# Patient Record
Sex: Male | Born: 1965 | Race: Black or African American | Hispanic: No | Marital: Married | Smoking: Current every day smoker
Health system: Southern US, Community
[De-identification: ages and names within clinical notes are randomized; demographics above are authoritative.]

## PROBLEM LIST (undated history)

## (undated) DIAGNOSIS — I10 Essential (primary) hypertension: Secondary | ICD-10-CM

---

## 2002-06-24 ENCOUNTER — Observation Stay (HOSPITAL_COMMUNITY): Admission: EM | Admit: 2002-06-24 | Discharge: 2002-06-24 | Payer: Self-pay | Admitting: Emergency Medicine

## 2002-06-24 ENCOUNTER — Encounter: Payer: Self-pay | Admitting: Family Medicine

## 2002-06-24 ENCOUNTER — Encounter: Payer: Self-pay | Admitting: Emergency Medicine

## 2002-06-30 ENCOUNTER — Encounter: Admission: RE | Admit: 2002-06-30 | Discharge: 2002-06-30 | Payer: Self-pay | Admitting: Family Medicine

## 2002-09-07 ENCOUNTER — Emergency Department (HOSPITAL_COMMUNITY): Admission: EM | Admit: 2002-09-07 | Discharge: 2002-09-07 | Payer: Self-pay | Admitting: Emergency Medicine

## 2002-09-07 ENCOUNTER — Encounter: Payer: Self-pay | Admitting: Emergency Medicine

## 2004-07-10 ENCOUNTER — Emergency Department (HOSPITAL_COMMUNITY): Admission: EM | Admit: 2004-07-10 | Discharge: 2004-07-10 | Payer: Self-pay | Admitting: Internal Medicine

## 2005-04-15 ENCOUNTER — Emergency Department (HOSPITAL_COMMUNITY): Admission: EM | Admit: 2005-04-15 | Discharge: 2005-04-15 | Payer: Self-pay | Admitting: Emergency Medicine

## 2005-07-08 ENCOUNTER — Emergency Department (HOSPITAL_COMMUNITY): Admission: EM | Admit: 2005-07-08 | Discharge: 2005-07-08 | Payer: Self-pay | Admitting: *Deleted

## 2005-12-16 ENCOUNTER — Emergency Department (HOSPITAL_COMMUNITY): Admission: EM | Admit: 2005-12-16 | Discharge: 2005-12-16 | Payer: Self-pay | Admitting: Emergency Medicine

## 2005-12-23 ENCOUNTER — Emergency Department (HOSPITAL_COMMUNITY): Admission: EM | Admit: 2005-12-23 | Discharge: 2005-12-23 | Payer: Self-pay | Admitting: Emergency Medicine

## 2009-04-28 ENCOUNTER — Emergency Department (HOSPITAL_COMMUNITY): Admission: EM | Admit: 2009-04-28 | Discharge: 2009-04-29 | Payer: Self-pay | Admitting: Emergency Medicine

## 2009-08-17 ENCOUNTER — Emergency Department (HOSPITAL_COMMUNITY)
Admission: EM | Admit: 2009-08-17 | Discharge: 2009-08-17 | Payer: Self-pay | Source: Home / Self Care | Admitting: Emergency Medicine

## 2010-08-07 LAB — URINALYSIS, ROUTINE W REFLEX MICROSCOPIC
Bilirubin Urine: NEGATIVE
Hgb urine dipstick: NEGATIVE
Nitrite: NEGATIVE
Protein, ur: NEGATIVE mg/dL

## 2010-10-04 NOTE — Discharge Summary (Signed)
NAME:  CHAMPION, CORALES                            ACCOUNT NO.:  [id number]   MEDICAL RECORD NO.:  [id number]                   PATIENT TYPE:  OBV   LOCATION:  2018                                 FACILITY:  [facility]   PHYSICIAN:  Noelle C. Merilynn Finland, M.D.           DATE OF BIRTH:  1966-03-02   DATE OF ADMISSION:  06/24/2002  DATE OF DISCHARGE:  06/24/2002                                 DISCHARGE SUMMARY   PRIMARY CARE PHYSICIAN:  None.   DISCHARGE DIAGNOSES:  1. Chest pain, ruled out for myocardial infarction.  2. Substance abuse.  3. Tobacco abuse.   DISCHARGE MEDICATIONS:  1. Aspirin 81 mg p.o. daily.  2. Zyban 150 mg p.o. x3 days, then 150 mg p.o. b.i.d., the patient is     advised to quit smoking one week after starting medication, and not to     take medication later then 6 p.m.   DISCHARGE INSTRUCTIONS:  1. No restrictions on activity.  2. Low cholesterol, low fat diet.  3. The patient was advised to stop using cocaine and stop smoking.  He was     also advised that he will need a cardiac stress test as an outpatient.   FOLLOWUP:  Dr. Alvira Philips at the [facility] on Friday,  07/08/02 at 3 p.m.   HISTORY OF PRESENT ILLNESS:  The patient is a 45 year old African-American  male who presented to the ER with several hours of left-sided and substernal  chest pain radiating to his left arm, neck, and back, onset at rest while  drinking beer, associated with shortness of breath and diaphoresis, not  relieved with aspirin, described as a stretching or tingling sensation.  He  was chest pain free by the time of presentation to the emergency room.   HOSPITAL COURSE:  Problem 1.  CHEST PAIN:  The patient was admitted to  telemetry, two sets of cardiac enzymes were negative at the time of  discharge.  TSH normal during admission, fasting lipid panel pending at the  time of discharge.  EKG on admission was unchanged from prior EKG in 1998,  which showed nonspecific ST  and T wave changes with J point elevation in  lead V2.  He was chest pain free throughout admission.  He was discharged  home on a daily aspirin and will need risk stratification with a cardiac  stress test as an outpatient.  Cardiac risk factors include cocaine use,  smoking, family history (the patient's mother had three myocardial  infarctions, the first around age 74).  Problem 2.  SUBSTANCE ABUSE:  The patient reports a history of chronic  cocaine use over the last several years.  Episodes of chest pain are  associated with cocaine use.  His last episode of use was reportedly two  days prior to admission.  Urine drug screen positive for cocaine and  marijuana on admission.  The patient declined  inpatient or outpatient  rehabilitation, stating that he would rather quit on his own.  He was  counseled extensively to quit using illegal drugs.  A case management  consult was ordered during hospitalization for rehabilitation counseling,  but the patient was discharged before this could be obtained.  Problem 3.  TOBACCO ABUSE:  The patient has a 10 year history of smoking 1/2  pack per day.  He was counseled extensively that this would exacerbate any  cardiac problems, and he requested medical treatment for this.  He stated  that he uses cigarettes mainly to relieve the stress and anxiety in his  life, and was given a prescription for Zyban as above, to follow up with his  primary care physician on this.   Also of note, the patient's portable chest x-ray was questionable for  cardiomegaly, and a PA and lateral chest x-ray to further evaluate this was  pending at the time of discharge.   INSTRUCTIONS FOR PRIMARY DOCTOR:  1. The patient should have a stress test for cardiac risk stratification.     He does have some baseline nonspecific ST and T wave changes, so Dr.     Darrick Penna or Dr. McDiarmid should be consulted as to whether an exercise     treadmill test or Cardiolite would be most  appropriate.  2. Pending studies at the time of discharge include fasting lipid panel, PA     and lateral chest x-ray.  3. The patient is currently using Zyban for smoking cessation.                                               Noelle C. Merilynn Finland, M.D.    NCR/MEDQ  D:  06/24/2002  T:  06/25/2002  Job:  [id number]   cc:   Alvira Philips, M.D.  [facility].  [facility] Resident  [place]  [place] [address]  Fax: [phone]

## 2011-09-08 ENCOUNTER — Emergency Department (HOSPITAL_COMMUNITY)
Admission: EM | Admit: 2011-09-08 | Discharge: 2011-09-08 | Disposition: A | Discharge status: Home / Self Care | Payer: Self-pay | Attending: Emergency Medicine | Admitting: Emergency Medicine

## 2011-09-08 ENCOUNTER — Encounter (HOSPITAL_COMMUNITY): Payer: Self-pay | Admitting: *Deleted

## 2011-09-08 DIAGNOSIS — K0889 Other specified disorders of teeth and supporting structures: Secondary | ICD-10-CM

## 2011-09-08 DIAGNOSIS — K089 Disorder of teeth and supporting structures, unspecified: Secondary | ICD-10-CM | POA: Insufficient documentation

## 2011-09-08 DIAGNOSIS — H6120 Impacted cerumen, unspecified ear: Secondary | ICD-10-CM | POA: Insufficient documentation

## 2011-09-08 MED ORDER — PENICILLIN V POTASSIUM 500 MG PO TABS: 500.0000 mg | ORAL_TABLET | Freq: Four times a day (QID) | ORAL | Status: DC

## 2011-09-08 NOTE — ED Provider Notes (Signed)
History     CSN: [id number]  Arrival date & time 09/08/11  4098   First MD Initiated Contact with Patient 09/08/11 1104      Chief Complaint  Patient presents with  . Otalgia    (Consider location/radiation/quality/duration/timing/severity/associated sxs/prior treatment) The history is provided by the patient.   patient reports 3 days of left ear pain.  He reports mild decreased hearing in his left ear.  He's not had 5 fevers or chills.  He has not had upper respiratory symptoms.  He does report he had a left lower first molar dental pain for several days which has not been improving.  He has no facial swelling.  Nothing worsens the symptoms.  Nothing improves his symptoms.  His symptoms are constant.  History reviewed. No pertinent past medical history.  History reviewed. No pertinent past surgical history.  History reviewed. No pertinent family history.  History  Substance Use Topics  . Smoking status: Current Everyday Smoker    Types: Cigarettes  . Smokeless tobacco: Not on file  . Alcohol Use: Yes     occassional      Review of Systems  HENT: Positive for ear pain.   All other systems reviewed and are negative.    Allergies  Review of patient's allergies indicates no known allergies.  Home Medications   Current Outpatient Rx  Name Route Sig Dispense Refill  . SINUS & ALLERGY 12 HOUR PO Oral Take 1 tablet by mouth 2 (two) times daily as needed. For sinus congestion    . PENICILLIN V POTASSIUM 500 MG PO TABS Oral Take 1 tablet (500 mg total) by mouth 4 (four) times daily. 28 tablet 0    BP 158/102  Pulse 69  Temp(Src) 98.4 F (36.9 C) (Oral)  Resp 16  SpO2 96%  Physical Exam  Nursing note and vitals reviewed. Constitutional: He is oriented to person, place, and time. He appears well-developed and well-nourished.  HENT:  Head: Normocephalic and atraumatic.       Impacted cerumen left ear.  Cerumen removed a normal left TM in appearance.  Patient with  significant dental decay throughout but significant decay and tenderness without gingival swelling at left lower first molar  Eyes: EOM are normal.  Neck: Normal range of motion.  Cardiovascular: Normal rate, regular rhythm, normal heart sounds and intact distal pulses.   Pulmonary/Chest: Effort normal and breath sounds normal. No respiratory distress.  Abdominal: Soft. He exhibits no distension. There is no tenderness.  Musculoskeletal: Normal range of motion.  Neurological: He is alert and oriented to person, place, and time.  Skin: Skin is warm and dry.  Psychiatric: He has a normal mood and affect. Judgment normal.    ED Course  EAR CERUMEN REMOVAL Performed by: Lyanne Co Authorized by: Lyanne Co Consent: Verbal consent obtained. Risks and benefits: risks, benefits and alternatives were discussed Consent given by: patient Required items: required blood products, implants, devices, and special equipment available Patient identity confirmed: verbally with patient Time out: Immediately prior to procedure a "time out" was called to verify the correct patient, procedure, equipment, support staff and site/side marked as required. Local anesthetic: none Location details: left ear Procedure type: curette Patient sedated: no Patient tolerance: Patient tolerated the procedure well with no immediate complications.   (including critical care time)  Labs Reviewed - No data to display No results found.   1. Cerumen impaction   2. Pain, dental       MDM  Will treat for possible early dental infection.  Left ear impacted cerumen, cerumen removed without difficulty.  Patient feels much better at this time        Lyanne Co, MD 09/08/11 1124

## 2011-09-08 NOTE — ED Notes (Signed)
To ED for eval of left ear ringing for past 3 days. Denies FB in ear.

## 2011-09-08 NOTE — Discharge Instructions (Signed)
Cerumen Impaction A cerumen impaction is when the wax in your ear forms a plug. This plug usually causes reduced hearing. Sometimes it also causes an earache or dizziness. Removing a cerumen impaction can be difficult and painful. The wax sticks to the ear canal. The canal is sensitive and bleeds easily. If you try to remove a heavy wax buildup with a cotton tipped swab, you may push it in further. Irrigation with water, suction, and small ear curettes may be used to clear out the wax. If the impaction is fixed to the skin in the ear canal, ear drops may be needed for a few days to loosen the wax. People who build up a lot of wax frequently can use ear wax removal products available in your local drugstore. SEEK MEDICAL CARE IF:  You develop an earache, increased hearing loss, or marked dizziness. Document Released: 06/12/2004 Document Revised: 04/24/2011 Document Reviewed: 08/02/2009 [facility] Patient Information 2012 [place], [place].Dental Pain A tooth ache may be caused by cavities (tooth decay). Cavities expose the nerve of the tooth to air and hot or cold temperatures. It may come from an infection or abscess (also called a boil or furuncle) around your tooth. It is also often caused by dental caries (tooth decay). This causes the pain you are having. DIAGNOSIS  Your caregiver can diagnose this problem by exam. TREATMENT   If caused by an infection, it may be treated with medications which kill germs (antibiotics) and pain medications as prescribed by your caregiver. Take medications as directed.   Only take over-the-counter or prescription medicines for pain, discomfort, or fever as directed by your caregiver.   Whether the tooth ache today is caused by infection or dental disease, you should see your dentist as soon as possible for further care.  SEEK MEDICAL CARE IF: The exam and treatment you received today has been provided on an emergency basis only. This is not a substitute for complete  medical or dental care. If your problem worsens or new problems (symptoms) appear, and you are unable to meet with your dentist, call or return to this location. SEEK IMMEDIATE MEDICAL CARE IF:   You have a fever.   You develop redness and swelling of your face, jaw, or neck.   You are unable to open your mouth.   You have severe pain uncontrolled by pain medicine.  MAKE SURE YOU:   Understand these instructions.   Will watch your condition.   Will get help right away if you are not doing well or get worse.  Document Released: 05/05/2005 Document Revised: 04/24/2011 Document Reviewed: 12/22/2007 [facility] Patient Information 2012 [place], [place].

## 2011-09-15 ENCOUNTER — Emergency Department (HOSPITAL_COMMUNITY)
Admission: EM | Admit: 2011-09-15 | Discharge: 2011-09-15 | Disposition: A | Discharge status: Home / Self Care | Payer: Self-pay | Attending: Emergency Medicine | Admitting: Emergency Medicine

## 2011-09-15 ENCOUNTER — Encounter (HOSPITAL_COMMUNITY): Payer: Self-pay | Admitting: *Deleted

## 2011-09-15 DIAGNOSIS — F17200 Nicotine dependence, unspecified, uncomplicated: Secondary | ICD-10-CM | POA: Insufficient documentation

## 2011-09-15 DIAGNOSIS — L738 Other specified follicular disorders: Secondary | ICD-10-CM | POA: Insufficient documentation

## 2011-09-15 DIAGNOSIS — L739 Follicular disorder, unspecified: Secondary | ICD-10-CM

## 2011-09-15 MED ORDER — ERYTHROMYCIN 2 % EX OINT: TOPICAL_OINTMENT | CUTANEOUS | Status: AC

## 2011-09-15 MED ORDER — IBUPROFEN 600 MG PO TABS: 600.0000 mg | ORAL_TABLET | Freq: Four times a day (QID) | ORAL | Status: AC | PRN

## 2011-09-15 NOTE — ED Notes (Signed)
PT c/o increasing swelling and pain to abcess to back of head.

## 2011-09-15 NOTE — ED Provider Notes (Signed)
History     CSN: [id number]  Arrival date & time 09/15/11  9562   First MD Initiated Contact with Patient 09/15/11 1123      Chief Complaint  Patient presents with  . Skin Problem    pain and swelling to occiput    (Consider location/radiation/quality/duration/timing/severity/associated sxs/prior treatment) HPI Pt with 1 week of bump to back of the head with increasing soreness. No fever chills, drainage.  History reviewed. No pertinent past medical history.  History reviewed. No pertinent past surgical history.  No family history on file.  History  Substance Use Topics  . Smoking status: Current Everyday Smoker    Types: Cigarettes  . Smokeless tobacco: Not on file  . Alcohol Use: Yes     occassional      Review of Systems  Constitutional: Negative for fever and chills.  HENT: Negative for neck pain.   Neurological: Negative for weakness, numbness and headaches.    Allergies  Review of patient's allergies indicates no known allergies.  Home Medications   Current Outpatient Rx  Name Route Sig Dispense Refill  . NAPHAZOLINE-PHENIRAMINE 0.025-0.3 % OP SOLN Both Eyes Place 1 drop into both eyes 4 (four) times daily as needed. For dry eyes    . SINUS & ALLERGY 12 HOUR PO Oral Take 1 tablet by mouth 2 (two) times daily as needed. For sinus congestion    . ERYTHROMYCIN 2 % EX OINT  Apply to affected area 2 times daily 25 g 0  . IBUPROFEN 600 MG PO TABS Oral Take 1 tablet (600 mg total) by mouth every 6 (six) hours as needed for pain. 30 tablet 0    BP 161/106  Pulse 69  Temp(Src) 97.9 F (36.6 C) (Oral)  Resp 16  SpO2 93%  Physical Exam  Nursing note and vitals reviewed. Constitutional: He is oriented to person, place, and time. He appears well-developed and well-nourished.  HENT:  Head: Atraumatic.  Neck: Neck supple.  Pulmonary/Chest: Effort normal.  Abdominal: Soft.  Musculoskeletal: Normal range of motion.  Neurological: He is alert and oriented to  person, place, and time.  Skin:       Folliculitis with mild induration (0.5 cm) and erythema at occiput. No fluctuation or drainage. Mildly ttp    ED Course  Procedures (including critical care time)  Labs Reviewed - No data to display No results found.   1. Folliculitis       MDM          Loren Racer, MD 09/15/11 1143

## 2011-09-15 NOTE — Discharge Instructions (Signed)
Folliculitis  °Folliculitis is an infection and inflammation of the hair follicles. Hair follicles become red and irritated. This inflammation is usually caused by bacteria. The bacteria thrive in warm, moist environments. This condition can be seen anywhere on the body.  °CAUSES °The most common cause of folliculitis is an infection by germs (bacteria). Fungal and viral infections can also cause the condition. Viral infections may be more common in people whose bodies are unable to fight disease well (weakened immune systems). Examples include people with: °· AIDS.  °· An organ transplant.  °· Cancer.  °People with depressed immune systems, diabetes, or obesity, have a greater risk of getting folliculitis than the general population. Certain chemicals, especially oils and tars, also can cause folliculitis. °SYMPTOMS °· An early sign of folliculitis is a small, white or yellow pus-filled, itchy lesion (pustule). These lesions appear on a red, inflamed follicle. They are usually less than 5 mm (.20 inches).  °· The most likely starting points are the scalp, thighs, legs, back and buttocks. Folliculitis is also frequently found in areas of repeated shaving.  °· When an infection of the follicle goes deeper, it becomes a boil or furuncle. A group of closely packed boils create a larger lesion (a carbuncle). These sores (lesions) tend to occur in hairy, sweaty areas of the body.  °TREATMENT  °· A doctor who specializes in skin problems (dermatologists) treats mild cases of folliculitis with antiseptic washes.  °· They also use a skin application which kills germs (topical antibiotics). Tea tree oil is a good topical antiseptic as well. It can be found at a health food store. A small percentage of individuals may develop an allergy to the tea tree oil.  °· Mild to moderate boils respond well to warm water compresses applied three times daily.  °· In some cases, oral antibiotics should be taken with the skin treatment.    °· If lesions contain large quantities of pus or fluid, your caregiver may drain them. This allows the topical antibiotics to get to the affected areas better.  °· Stubborn cases of folliculitis may respond to laser hair removal. This process uses a high intensity light beam (a laser) to destroy the follicle and reduces the scarring from folliculitis. After laser hair removal, hair will no longer grow in the laser treated area.  °Patients with long-lasting folliculitis need to find out where the infection is coming from. Germs can live in the nostrils of the patient. This can trigger an outbreak now and then. Sometimes the bacteria live in the nostrils of a family member. This person does not develop the disorder but they repeatedly re-expose others to the germ. To break the cycle of recurrence in the patient, the family member must also undergo treatment. °PREVENTION  °· Individuals who are predisposed to folliculitis should be extremely careful about personal hygiene.  °· Application of antiseptic washes may help prevent recurrences.  °· A topical antibiotic cream, mupirocin (Bactroban®), has been effective at reducing bacteria in the nostrils. It is applied inside the nose with your little finger. This is done twice daily for a week. Then it is repeated every 6 months.  °· Because follicle disorders tend to come back, patients must receive follow-up care. Your caregiver may be able to recognize a recurrence before it becomes severe.  °SEEK IMMEDIATE MEDICAL CARE IF:  °· You develop redness, swelling, or increasing pain in the area.  °· You have a fever.  °· You are not improving with treatment   or are getting worse.  °· You have any other questions or concerns.  °Document Released: 07/14/2001 Document Revised: 04/24/2011 Document Reviewed: 05/10/2008 °[employer]® Patient Information ©2012 [employer]. °

## 2013-09-12 ENCOUNTER — Emergency Department (HOSPITAL_COMMUNITY)
Admission: EM | Admit: 2013-09-12 | Discharge: 2013-09-12 | Disposition: A | Discharge status: Home / Self Care | Payer: Self-pay | Attending: Emergency Medicine | Admitting: Emergency Medicine

## 2013-09-12 ENCOUNTER — Encounter (HOSPITAL_COMMUNITY): Payer: Self-pay | Admitting: Emergency Medicine

## 2013-09-12 DIAGNOSIS — F17200 Nicotine dependence, unspecified, uncomplicated: Secondary | ICD-10-CM | POA: Insufficient documentation

## 2013-09-12 DIAGNOSIS — M5431 Sciatica, right side: Secondary | ICD-10-CM

## 2013-09-12 DIAGNOSIS — M5430 Sciatica, unspecified side: Secondary | ICD-10-CM | POA: Insufficient documentation

## 2013-09-12 DIAGNOSIS — Z79899 Other long term (current) drug therapy: Secondary | ICD-10-CM | POA: Insufficient documentation

## 2013-09-12 MED ORDER — NAPROXEN 500 MG PO TABS: 500.0000 mg | ORAL_TABLET | Freq: Two times a day (BID) | ORAL | Status: AC

## 2013-09-12 MED ORDER — CYCLOBENZAPRINE HCL 5 MG PO TABS: 5.0000 mg | ORAL_TABLET | Freq: Three times a day (TID) | ORAL | Status: AC | PRN

## 2013-09-12 NOTE — ED Provider Notes (Signed)
CSN: [id number]     Arrival date & time 09/12/13  1125 History  This chart was scribed for non-physician practitioner, Coral CeoJessica Boomer Winders, PA-C working with Audree CamelScott T Goldston, MD by Greggory StallionKayla Andersen, ED scribe. This patient was seen in room WTR5/WTR5 and the patient's care was started at 12:02 PM.   Chief Complaint  Patient presents with  . Leg Pain   The history is provided by the patient. No language interpreter was used.   HPI Comments: Jacob Soto is a 48 y.o. male with no PMH who presents to the Emergency Department complaining of burning right lower back pain that radiates down the back of his leg to his calf that started several months ago. Pain started worsening a few weeks ago after starting a new exercise routine. Pt tried to start exercising to help the pain but he thinks it worsened it. He has intermittent leg swelling after working for long periods of time, but denies this currently. Denies recent injury or trauma. Movement makes the pain worse. Pt has seen a chiropractor for the same about 5 years ago and told it could be sciatic pain. PERC negative. Denies fever, abdominal pain, bowel or bladder incontinence, difficulty urinating, weakness, numbness, tingling. Denies history of cancer or IV drug use.    History reviewed. No pertinent past medical history. History reviewed. No pertinent past surgical history. History reviewed. No pertinent family history. History  Substance Use Topics  . Smoking status: Current Every Day Smoker    Types: Cigarettes  . Smokeless tobacco: Not on file  . Alcohol Use: Yes     Comment: occassional    Review of Systems  Constitutional: Negative for fever, chills, activity change, appetite change and fatigue.  Respiratory: Negative for cough and shortness of breath.   Cardiovascular: Negative for chest pain and leg swelling.  Gastrointestinal: Negative for nausea, vomiting and abdominal pain.  Genitourinary: Negative for dysuria, flank pain and difficulty  urinating.       Negative for bowel or bladder incontinence.  Musculoskeletal: Positive for back pain and myalgias. Negative for arthralgias, gait problem, joint swelling, neck pain and neck stiffness.  Skin: Negative for color change and wound.  Neurological: Negative for dizziness, weakness, numbness and headaches.  All other systems reviewed and are negative.  Allergies  Review of patient's allergies indicates no known allergies.  Home Medications   Prior to Admission medications   Medication Sig Start Date End Date Taking? Authorizing Provider  naphazoline-pheniramine (NAPHCON-A) 0.025-0.3 % ophthalmic solution Place 1 drop into both eyes 4 (four) times daily as needed. For dry eyes    Historical Provider, MD  Pseudoephedrine HCl (SINUS & ALLERGY 12 HOUR PO) Take 1 tablet by mouth 2 (two) times daily as needed. For sinus congestion    Historical Provider, MD   BP 158/102  Pulse 60  Temp(Src) 98.2 F (36.8 C) (Oral)  Resp 18  SpO2 97%  Filed Vitals:   09/12/13 1137 09/12/13 1232  BP: 158/102 155/86  Pulse: 60   Temp: 98.2 F (36.8 C)   TempSrc: Oral   Resp: 18   SpO2: 97%     Physical Exam  Nursing note and vitals reviewed. Constitutional: He is oriented to person, place, and time. He appears well-developed and well-nourished. No distress.  HENT:  Head: Normocephalic and atraumatic.  Right Ear: External ear normal.  Left Ear: External ear normal.  Mouth/Throat: Oropharynx is clear and moist.  Eyes: Conjunctivae and EOM are normal.  Neck: Neck supple. No tracheal  deviation present.  No cervical spinal or paraspinal tenderness to palpation throughout.  No limitations with neck ROM.    Cardiovascular: Normal rate, regular rhythm, normal heart sounds and intact distal pulses.  Exam reveals no gallop and no friction rub.   No murmur heard. Dorsalis pedis pulses present and equal bilaterally  Pulmonary/Chest: Effort normal and breath sounds normal. No respiratory  distress. He has no wheezes. He has no rhonchi. He has no rales.  Abdominal: Soft. He exhibits no distension. There is no tenderness.  Musculoskeletal: Normal range of motion. He exhibits tenderness. He exhibits no edema.       Arms: Tenderness to palpation to the right paraspinal lumbar muscles diffusely. Positive straight leg raise on the right. No right knee tenderness. Strength 5/5 in the upper and lower extremities bilaterally. Patient able to ambulate without difficulty or ataxia. No LE edema or calf tenderness bilaterally.   Neurological: He is alert and oriented to person, place, and time.  Patellar reflexes intact bilaterally. Gross sensation intact in the LE bilaterally  Skin: Skin is warm and dry. He is not diaphoretic.  Psychiatric: He has a normal mood and affect. His behavior is normal.    ED Course  Procedures (including critical care time)  DIAGNOSTIC STUDIES: Oxygen Saturation is 97% on RA, normal by my interpretation.    COORDINATION OF CARE: 12:11 PM-Discussed treatment plan which includes a muscle relaxer and naprosyn with pt at bedside and pt agreed to plan. Will give pt orthopedic referrals and advised him to follow up.   Labs Review Labs Reviewed - No data to display  Imaging Review No results found.   EKG Interpretation None      MDM   Jacob Soto is a 48 y.o. male with no PMH who presents to the Emergency Department complaining of burning right lower back pain that radiates down the back of his leg to his calf that started several months ago. Etiology of back and leg pain possibly due to sciatica. No injuries or trauma. Patient neurovascularly intact. No warning signs or symptoms of back pain including loss of bowel or bladder control, night sweats, waking from sleep with back pain, unexplained fevers or weight loss, history of cancer, or IV drug use. No concern for cauda equina, epidural abscess, or other serious/life threatening cause of back pain. No  evidence of a DVT. No risk factors for this. BP elevated during ED visit. Patient states was on BP medications previously but was taken off of them due to exercise and diet change. Patient instructed to follow-up with his PCP regarding this. Return precautions, discharge instructions, and follow-up was discussed with the patient before discharge.     Discharge Medication List as of 09/12/2013 12:17 PM    START taking these medications   Details  cyclobenzaprine (FLEXERIL) 5 MG tablet Take 1 tablet (5 mg total) by mouth 3 (three) times daily as needed for muscle spasms., Starting 09/12/2013, Until Discontinued, Print    naproxen (NAPROSYN) 500 MG tablet Take 1 tablet (500 mg total) by mouth 2 (two) times daily with a meal., Starting 09/12/2013, Until Discontinued, Print         Final impressions: 1. Back pain with right-sided sciatica       Luiz IronJessica Katlin Jasier Calabretta PA-C   I personally performed the services described in this documentation, which was scribed in my presence. The recorded information has been reviewed and is accurate.  Jillyn LedgerJessica K Rashawn Rayman, PA-C 09/13/13 1320

## 2013-09-12 NOTE — Discharge Instructions (Signed)
Take Flexeril for muscle spasm at night - Please be careful with this medication.  It can cause drowsiness.  Use caution while driving, operating machinery, drinking alcohol, or any other activities that may impair your physical or mental abilities.   Take naprosyn for pain twice daily with food Return to the emergency department if you develop any changing/worsening condition, leg swelling/redness, weakness, loss of bowel or bladder function, fever, loss of sensation or any other concerns (please read additional information regarding your condition below)  Back Pain, Adult Back pain is very common. The pain often gets better over time. The cause of back pain is usually not dangerous. Most people can learn to manage their back pain on their own.  HOME CARE   Stay active. Start with short walks on flat ground if you can. Try to walk farther each day.  Do not sit, drive, or stand in one place for more than 30 minutes. Do not stay in bed.  Do not avoid exercise or work. Activity can help your back heal faster.  Be careful when you bend or lift an object. Bend at your knees, keep the object close to you, and do not twist.  Sleep on a firm mattress. Lie on your side, and bend your knees. If you lie on your back, put a pillow under your knees.  Only take medicines as told by your doctor.  Put ice on the injured area.  Put ice in a plastic bag.  Place a towel between your skin and the bag.  Leave the ice on for 15-20 minutes, 03-04 times a day for the first 2 to 3 days. After that, you can switch between ice and heat packs.  Ask your doctor about back exercises or massage.  Avoid feeling anxious or stressed. Find good ways to deal with stress, such as exercise. GET HELP RIGHT AWAY IF:   Your pain does not go away with rest or medicine.  Your pain does not go away in 1 week.  You have new problems.  You do not feel well.  The pain spreads into your legs.  You cannot control when  you poop (bowel movement) or pee (urinate).  Your arms or legs feel weak or lose feeling (numbness).  You feel sick to your stomach (nauseous) or throw up (vomit).  You have belly (abdominal) pain.  You feel like you may pass out (faint). MAKE SURE YOU:   Understand these instructions.  Will watch your condition.  Will get help right away if you are not doing well or get worse. Document Released: 10/22/2007 Document Revised: 07/28/2011 Document Reviewed: 09/23/2010 [facility] Patient Information 2014 [place], [place].  Musculoskeletal Pain Musculoskeletal pain is muscle and boney aches and pains. These pains can occur in any part of the body. Your caregiver may treat you without knowing the cause of the pain. They may treat you if blood or urine tests, X-rays, and other tests were normal.  CAUSES There is often not a definite cause or reason for these pains. These pains may be caused by a type of germ (virus). The discomfort may also come from overuse. Overuse includes working out too hard when your body is not fit. Boney aches also come from weather changes. Bone is sensitive to atmospheric pressure changes. HOME CARE INSTRUCTIONS   Ask when your test results will be ready. Make sure you get your test results.  Only take over-the-counter or prescription medicines for pain, discomfort, or fever as directed by your caregiver.  If you were given medications for your condition, do not drive, operate machinery or power tools, or sign legal documents for 24 hours. Do not drink alcohol. Do not take sleeping pills or other medications that may interfere with treatment.  Continue all activities unless the activities cause more pain. When the pain lessens, slowly resume normal activities. Gradually increase the intensity and duration of the activities or exercise.  During periods of severe pain, bed rest may be helpful. Lay or sit in any position that is comfortable.  Putting ice on the injured  area.  Put ice in a bag.  Place a towel between your skin and the bag.  Leave the ice on for 15 to 20 minutes, 3 to 4 times a day.  Follow up with your caregiver for continued problems and no reason can be found for the pain. If the pain becomes worse or does not go away, it may be necessary to repeat tests or do additional testing. Your caregiver may need to look further for a possible cause. SEEK IMMEDIATE MEDICAL CARE IF:  You have pain that is getting worse and is not relieved by medications.  You develop chest pain that is associated with shortness or breath, sweating, feeling sick to your stomach (nauseous), or throw up (vomit).  Your pain becomes localized to the abdomen.  You develop any new symptoms that seem different or that concern you. MAKE SURE YOU:   Understand these instructions.  Will watch your condition.  Will get help right away if you are not doing well or get worse. Document Released: 05/05/2005 Document Revised: 07/28/2011 Document Reviewed: 01/07/2013 [facility] Patient Information 2014 [place], [place].   Emergency Department Resource Guide 1) Find a Doctor and Pay Out of Pocket Although you won't have to find out who is covered by your insurance plan, it is a good idea to ask around and get recommendations. You will then need to call the office and see if the doctor you have chosen will accept you as a new patient and what types of options they offer for patients who are self-pay. Some doctors offer discounts or will set up payment plans for their patients who do not have insurance, but you will need to ask so you aren't surprised when you get to your appointment.  2) Contact Your Local Health Department Not all health departments have doctors that can see patients for sick visits, but many do, so it is worth a call to see if yours does. If you don't know where your local health department is, you can check in your phone book. The CDC also has a tool to help  you locate your state's health department, and many state websites also have listings of all of their local health departments.  3) Find a Walk-in Clinic If your illness is not likely to be very severe or complicated, you may want to try a walk in clinic. These are popping up all over the country in pharmacies, drugstores, and shopping centers. They're usually staffed by nurse practitioners or physician assistants that have been trained to treat common illnesses and complaints. They're usually fairly quick and inexpensive. However, if you have serious medical issues or chronic medical problems, these are probably not your best option.  No Primary Care Doctor: - Call Health Connect at  [phone] - they can help you locate a primary care doctor that  accepts your insurance, provides certain services, etc. - Physician Referral Service- [phone]  Chronic Pain Problems: Organization  Address  Phone   Notes  °[facility]  ([phone]) [phone] Patients need to be referred by their primary care doctor.  ° °Medication Assistance: °Organization         Address  Phone   Notes  °[place] [facility] [address] °[place], [place] [address] (336) 641-8030 --Must be a resident of [place] °-- Must have NO insurance coverage whatsoever (no Medicaid/ Medicare, etc.) °-- The pt. MUST have a primary care doctor that directs their care regularly and follows them in the community °  °[facility]  (866) 331-1348   °United Way  (888) 892-1162   ° °Agencies that provide inexpensive medical care: °Organization         Address  Phone   Notes  °[facility]  (336) 832-8035   °[facility]    (336) 832-7272   °[facility] [address] °[place], [place] [address] (336) 832-4777   °[facility] [address], °[place] (336) 271-4999   °[facility]    (336) 373-0678   °[facility]    (336) 272-1050   °[facility] ° [address], [place] Phone:  (336) 832-4444, Fax:  (336) 832-4440 Hours of Operation:  9 am - 6 pm, M-F.  Also accepts Medicaid/Medicare and self-pay.  °[facility] ° [address], [place] Phone: (336) [phone], Fax: (336) [phone]. Hours of Operation:  8:30 am - 5:30 pm, M-F.  Also accepts Medicaid and self-pay.  °[facility] [place] [address], [place] Phone: (336) 878-6027   °[facility] [address], [place], [place] (336)723-1848, Ext. 123 Mondays & Thursdays: 7-9 AM.  First 15 patients are seen on a first come, first serve basis. °  ° °Medicaid-accepting [place] Providers: ° °Organization         Address  Phone   Notes  °[facility] [address], [place] (336) 641-2100 Also accepts self-pay patients.  °[facility] [address], [place] ° (336) 856-9996   °[facility] [address], [place] (336) 288-8857   °[facility] [address] [place] [address], [place] (336) 299-7000   °Veita Bland [address], [place]  ° (336) 373-1557 Only accepts [place] Access Medicaid patients after they have their name applied to their card.  ° °Self-Pay (no insurance) in [place]: ° °Organization         Address  Phone   Notes  °Sickle Cell Patients, [facility] [address], [place] (336) 832-1970   °[facility] [address], [place] (336) 832-4400   °[facility] [place] ° [address], [place] (336) 992-4800   °[facility]/Dr. Osei-Bonsu ° [address] [place] [address], [place] or [address], [place] (336) 841-8500 Phone number for both [place] and [place] locations is the same.  °[facility] [address],  [place] (336) 299-0000   °[facility] [address] [place] [address], [place] or [address] (336) 852-7530 °(336) 878-2260   °[facility] [address], [place] (336) 350-1642, phone; (336) 294-5005, fax Sees patients 1st and 3rd Saturday of every month.  Must not qualify   for public or private insurance (i.e. Medicaid, Medicare, [place] Health Choice, Veterans' Benefits) • Household income should be no more than 200% of the poverty level •The clinic cannot treat you if you are pregnant or think you are pregnant • Sexually transmitted diseases are not treated at the clinic.  ° ° °Dental Care: °Organization         Address  Phone  Notes  °[facility] [facility] [address], [place] (336) 641-6152 Accepts children up to age 21 who are enrolled in Medicaid or [place] Health Choice; pregnant women with a Medicaid card; and children who have applied for Medicaid or [place] Health Choice, but were declined, whose parents can pay a reduced fee at time of service.  °[facility] [place]  [address], [place] (336) 641-7733 Accepts children up to age 21 who are enrolled in Medicaid or [place] Health Choice; pregnant women with a Medicaid card; and children who have applied for Medicaid or [place] Health Choice, but were declined, whose parents can pay a reduced fee at time of service.  °[facility] ° [address], [place] (336) 641-4533 Patients are seen by appointment only. Walk-ins are not accepted. [facility] will see patients 18 years of age and older. °Monday - Tuesday (8am-5pm) °Most Wednesdays (8:30-5pm) °$30 per visit, cash only  °[facility] ° [address], [place] (336) 641-4533 Patients are seen by appointment only. Walk-ins are not accepted. [facility] will see patients 18 years of age and older. °One Wednesday Evening  (Monthly: Volunteer Based).  $30 per visit, cash only  °[facility]  (919) 537-3737 for adults; Children under age 4, call Graduate Pediatric Dentistry at ([phone]) [phone]. Children aged 4-14, please call (919) [phone] to request a pediatric application. ° Dental services are provided in all areas of dental care including fillings, crowns and bridges, complete and partial dentures, implants, gum treatment, root canals, and extractions. Preventive care is also provided. Treatment is provided to both adults and children. °Patients are selected via a lottery and there is often a waiting list. °  °[facility] [address], ° ° (336) 763-8833 [email or web address] °  °[facility] [address], [place], [place] (336)723-1848, Ext. 123 Second and Fourth Thursday of each month, opens at 6:30 AM; Clinic ends at 9 AM.  Patients are seen on a first-come first-served basis, and a limited number are seen during each clinic.  ° °[facility] ° [address], [place], [place] (336) 723-7904   Eligibility Requirements °You must have lived in [place], [place], or [place] counties for at least the last three months. °  You cannot be eligible for state or federal sponsored healthcare insurance, including Veterans Administration, Medicaid, or Medicare. °  You generally cannot be eligible for healthcare insurance through your employer.  °  How to apply: °Eligibility screenings are held every Tuesday and Wednesday afternoon from 1:00 pm until 4:00 pm. You do not need an appointment for the interview!  °[facility] [address], [place], [place] 336-631-2330   °[facility] [facility]  336-342-8273   °[facility]  336-703-3100   °[facility]  336-570-6415   ° °Behavioral Health Resources in the Community: °Intensive Outpatient Programs °Organization         Address  Phone    Notes  °[place]  [facility] [address], [place], [place] 336-878-6098   °[facility] [address], [place], [place] 336-832-9800   °[facility] [address], [place], [place] ° 336-882-2125   °[facility] [address],  °[place], [place] 1-800-853-5163 or 336-641-4981   °Substance Abuse Resources °Organization         Address  Phone  Notes  °[facility]  336-882-2125   °[facility]  336-784-9470   °[facility]  336-285-9073   °[facility]  336-845-3988   °Residential & Outpatient Substance Abuse Program  1-800-659-3381   °Psychological Services °Organization         Address  Phone  Notes  °[facility]  336- 832-9600   °[facility]  336- 378-7881   °[facility] [address], [place] 1-800-853-5163 or 336-641-4981   ° °Mobile Crisis Teams °Organization         Address  Phone  Notes  °[facility], Mobile Crisis Care Unit  1-877-626-1772   °Assertive °[facility] ° [address] [place], [place] 336-834-9664   °Sharon DeEsch [address] °[place] 336-554-5454   ° °Self-Help/Support Groups °Organization         Address  Phone             Notes  °[facility] [place] - variety of support groups  336- 373-1402 Call for more information  °Narcotics Anonymous (NA), [facility] [address], °[place]  2 meetings at this location  ° °Residential Treatment Programs °Organization         Address  Phone  Notes  °[facility] [address],    °[place]  1-866-801-8205   °[facility] ° [address], [place], [place] 704-293-8524   °[facility] [address], [place] 336-845-3988 Admissions: 8am-3pm M-F  °[facility] [address]    °[place], [place] 336-841-1104   °[facility] [address],  [place], [place] 336-379-7146   °[facility] [address]  °[place], [place] 336-285-9073   °[facility] Intensive Outpatient [address], [place], [place] 336-852-3033   °[facility] [address]  °[place], [place] 1-877-615-2722 or 336-784-9470   °[facility] [address] [place], [place] 336-227-7417 Accepts Medicaid  °[facility] [address]  °[place] 1-800-659-3381 Substance Abuse/Addiction Treatment  ° °[place] Behavioral Health Resources °Organization         Address  Phone  Notes  °[facility]  (888) 581-9988   °Julie Brannon, PhD [address] [place], [place]   (336) 349-5553 or (336) 951-0000   °[facility]   [address] °[place], [place] (336) 349-4454   °[facility] [address], [place], [place] (336) 342-8316 Insurance/Medicaid/sponsorship through [facility]  °[facility] [address]                                    [place], [place] (336) 342-8316 Therapy/tele-psych/case  °[facility] [address]  ° , [place] (336) 349-2233    °Dr. Arfeen  (336) 349-4544   °[facility] [place]  [employer]   [facility] 1) [address], [place] 2) [address], [place] 3)  [address], [place] (724)862-9347(336) 720-427-6127 [phone](336) [phone]  [phone](336) [phone]   [place] [facility] (516) 780-7852(336) 737-159-7130 or 367-810-6597(336) (865) 180-1420 (After Hours)

## 2013-09-12 NOTE — Progress Notes (Signed)
[facility] provided pt with a list of primary care resources, highlighting [facility]. Patient stated that he had not [facility]. Explained to patient that [facility] but [facility]. Explained to pt about [facility] program and how [facility] could help him get proper paperwork to re-enroll.

## 2013-09-12 NOTE — ED Notes (Signed)
Pt states he has had chronic pain to right leg.  Has recently began an exercise class.  States pain is increasing to right leg and is not improving.  Pain is from rt thigh down right calf.  Feels like a charlie horse.

## 2013-09-21 NOTE — ED Provider Notes (Signed)
Medical screening examination/treatment/procedure(s) were performed by non-physician practitioner and as supervising physician I was immediately available for consultation/collaboration.   EKG Interpretation None        Amorita Vanrossum T Theodore Virgin, MD 09/21/13 0702 

## 2014-11-12 ENCOUNTER — Emergency Department (HOSPITAL_COMMUNITY)
Admission: EM | Admit: 2014-11-12 | Discharge: 2014-11-12 | Disposition: A | Discharge status: Home / Self Care | Payer: No Typology Code available for payment source | Attending: Emergency Medicine | Admitting: Emergency Medicine

## 2014-11-12 ENCOUNTER — Emergency Department (HOSPITAL_COMMUNITY): Payer: No Typology Code available for payment source

## 2014-11-12 ENCOUNTER — Encounter (HOSPITAL_COMMUNITY): Payer: Self-pay | Admitting: *Deleted

## 2014-11-12 DIAGNOSIS — Y92310 Basketball court as the place of occurrence of the external cause: Secondary | ICD-10-CM | POA: Diagnosis not present

## 2014-11-12 DIAGNOSIS — X58XXXA Exposure to other specified factors, initial encounter: Secondary | ICD-10-CM | POA: Diagnosis not present

## 2014-11-12 DIAGNOSIS — Y998 Other external cause status: Secondary | ICD-10-CM | POA: Insufficient documentation

## 2014-11-12 DIAGNOSIS — Z720 Tobacco use: Secondary | ICD-10-CM | POA: Insufficient documentation

## 2014-11-12 DIAGNOSIS — Y9367 Activity, basketball: Secondary | ICD-10-CM | POA: Insufficient documentation

## 2014-11-12 DIAGNOSIS — Z79899 Other long term (current) drug therapy: Secondary | ICD-10-CM | POA: Diagnosis not present

## 2014-11-12 DIAGNOSIS — S40012A Contusion of left shoulder, initial encounter: Secondary | ICD-10-CM | POA: Insufficient documentation

## 2014-11-12 DIAGNOSIS — S4992XA Unspecified injury of left shoulder and upper arm, initial encounter: Secondary | ICD-10-CM

## 2014-11-12 DIAGNOSIS — T148XXA Other injury of unspecified body region, initial encounter: Secondary | ICD-10-CM

## 2014-11-12 DIAGNOSIS — Z791 Long term (current) use of non-steroidal anti-inflammatories (NSAID): Secondary | ICD-10-CM | POA: Diagnosis not present

## 2014-11-12 DIAGNOSIS — I10 Essential (primary) hypertension: Secondary | ICD-10-CM | POA: Insufficient documentation

## 2014-11-12 HISTORY — DX: Essential (primary) hypertension: I10

## 2014-11-12 MED ORDER — OXYCODONE-ACETAMINOPHEN 5-325 MG PO TABS
1.0000 | ORAL_TABLET | Freq: Once | ORAL | Status: AC
Start: 2014-11-12 — End: 2014-11-12
  Administered 2014-11-12: 1 via ORAL
  Filled 2014-11-12: qty 1

## 2014-11-12 MED ORDER — IBUPROFEN 600 MG PO TABS: 600.0000 mg | ORAL_TABLET | Freq: Four times a day (QID) | ORAL | Status: AC | PRN

## 2014-11-12 MED ORDER — ONDANSETRON 4 MG PO TBDP
8.0000 mg | ORAL_TABLET | Freq: Once | ORAL | Status: AC
  Administered 2014-11-12: 8 mg via ORAL
  Filled 2014-11-12: qty 2

## 2014-11-12 MED ORDER — TRAMADOL HCL 50 MG PO TABS: 50.0000 mg | ORAL_TABLET | Freq: Four times a day (QID) | ORAL | Status: AC | PRN

## 2014-11-12 NOTE — ED Notes (Signed)
The pt is c/o pain innhis lt shoulder .  He injured it  2 days ago.  He cannot lift  His shoulder and cannot  Perform range of motion  Movements due to the pain

## 2014-11-12 NOTE — ED Provider Notes (Signed)
CSN: [id number]     Arrival date & time 11/12/14  0225 History   This chart was scribed for Derwood Kaplan, MD by Arlan Organ, ED Scribe. This patient was seen in room A02C/A02C and the patient's care was started 3:41 AM.   Chief Complaint  Patient presents with  . Shoulder Injury   The history is provided by the patient. No language interpreter was used.    HPI Comments: Jacob Soto, R hand dominant is a 49 y.o. male who presents to the Emergency Department complaining of constant, ongoing, unchanged L shoulder pain x 2 days. Pt states he fell directly on his shoulder approximately 2 days ago while playing basketball with friends. Pain is exacerbated when attempting to lift his L arm over his head. No OTC medications attempted prior to arrival. However, ice application attempted without any long term improvement. No recent fever or chills. No numbness or loss of sensation to shoulder. PMHx includes HTN. No known allergies to medications.  Past Medical History  Diagnosis Date  . Hypertension    History reviewed. No pertinent past surgical history. No family history on file. History  Substance Use Topics  . Smoking status: Current Every Day Smoker    Types: Cigarettes  . Smokeless tobacco: Not on file  . Alcohol Use: Yes     Comment: occassional    Review of Systems  Constitutional: Negative for fever and chills.  Respiratory: Negative for shortness of breath.   Cardiovascular: Negative for chest pain.  Gastrointestinal: Negative for nausea, vomiting and abdominal pain.  Musculoskeletal: Positive for joint swelling and arthralgias.  All other systems reviewed and are negative.     Allergies  Review of patient's allergies indicates no known allergies.  Home Medications   Prior to Admission medications   Medication Sig Start Date End Date Taking? Authorizing Provider  cyclobenzaprine (FLEXERIL) 5 MG tablet Take 1 tablet (5 mg total) by mouth 3 (three) times daily as needed  for muscle spasms. 09/12/13   Jillyn Ledger, PA-C  ibuprofen (ADVIL,MOTRIN) 600 MG tablet Take 1 tablet (600 mg total) by mouth every 6 (six) hours as needed. 11/12/14   Derwood Kaplan, MD  naphazoline-pheniramine (NAPHCON-A) 0.025-0.3 % ophthalmic solution Place 1 drop into both eyes 4 (four) times daily as needed. For dry eyes    Historical Provider, MD  naproxen (NAPROSYN) 500 MG tablet Take 1 tablet (500 mg total) by mouth 2 (two) times daily with a meal. 09/12/13   Jillyn Ledger, PA-C  Pseudoephedrine HCl (SINUS & ALLERGY 12 HOUR PO) Take 1 tablet by mouth 2 (two) times daily as needed. For sinus congestion    Historical Provider, MD  traMADol (ULTRAM) 50 MG tablet Take 1 tablet (50 mg total) by mouth every 6 (six) hours as needed. 11/12/14   Derwood Kaplan, MD   Triage Vitals: BP 180/108 mmHg  Pulse 61  Temp(Src) 97.7 F (36.5 C) (Oral)  Resp 18  SpO2 100%   Physical Exam  Constitutional: He is oriented to person, place, and time. He appears well-developed and well-nourished.  HENT:  Head: Normocephalic and atraumatic.  Eyes: EOM are normal.  Neck: Normal range of motion.  Cardiovascular: Normal rate, regular rhythm, normal heart sounds and intact distal pulses.   Pulmonary/Chest: Effort normal and breath sounds normal. No respiratory distress.  Abdominal: Soft. He exhibits no distension. There is no tenderness.  Musculoskeletal: Normal range of motion. He exhibits tenderness.  Bilateral shoulders show promer symmetry Mild swelling to L shoulder  No tenderness over AC joint of L shoulder Anterior GH joint tenderness of L shoulder Mild tendereness over L proximal humerus  Tenderness with abduction starting at 10 degrees Tenderness with external more than internal rotation of the shoulder Tenderness with forward flexion   Neurological: He is alert and oriented to person, place, and time.  Skin: Skin is warm and dry.  Psychiatric: He has a normal mood and affect. Judgment  normal.  Nursing note and vitals reviewed.   ED Course  Procedures (including critical care time)  DIAGNOSTIC STUDIES: Oxygen Saturation is 97% on RA, Normal by my interpretation.    COORDINATION OF CARE: 3:46 AM- Will order DG shoulder L. Will give Zofran and  Percocet. Discussed treatment plan with pt at bedside and pt agreed to plan.     Labs Review Labs Reviewed - No data to display  Imaging Review Dg Shoulder Left  11/12/2014   CLINICAL DATA:  Status post fall 4 days ago, with left shoulder pain. Initial encounter.  EXAM: LEFT SHOULDER - 2+ VIEW  COMPARISON:  None.  FINDINGS: There is no evidence of fracture or dislocation. The left humeral head is seated within the glenoid fossa. The acromioclavicular joint is unremarkable in appearance. No significant soft tissue abnormalities are seen. The visualized portions of the left lung are clear.  IMPRESSION: No evidence of fracture or dislocation.   Electronically Signed   By: Roanna Raider M.D.   On: 11/12/2014 03:11     EKG Interpretation None      MDM   Final diagnoses:  Shoulder injury, left, initial encounter  Contusion   I personally performed the services described in this documentation, which was scribed in my presence. The recorded information has been reviewed and is accurate.  Pt comes in with cc of shoulder pain. Blunt trauma to the shoulder - but might have fallen with outstretched extremity as well - and so the ddx is contusion, rotator cuff tear/injury, labrum injury and bursitis. Will give f/u info with sports med.  Derwood Kaplan, MD 11/12/14 [phone]

## 2014-11-12 NOTE — Discharge Instructions (Signed)
Please take the meds prescribed and see the doctor as soon as possible.   Contusion A contusion is a deep bruise. Contusions are the result of an injury that caused bleeding under the skin. The contusion may turn blue, purple, or yellow. Minor injuries will give you a painless contusion, but more severe contusions may stay painful and swollen for a few weeks.  CAUSES  A contusion is usually caused by a blow, trauma, or direct force to an area of the body. SYMPTOMS   Swelling and redness of the injured area.  Bruising of the injured area.  Tenderness and soreness of the injured area.  Pain. DIAGNOSIS  The diagnosis can be made by taking a history and physical exam. An X-ray, CT scan, or MRI may be needed to determine if there were any associated injuries, such as fractures. TREATMENT  Specific treatment will depend on what area of the body was injured. In general, the best treatment for a contusion is resting, icing, elevating, and applying cold compresses to the injured area. Over-the-counter medicines may also be recommended for pain control. Ask your caregiver what the best treatment is for your contusion. HOME CARE INSTRUCTIONS   Put ice on the injured area.  Put ice in a plastic bag.  Place a towel between your skin and the bag.  Leave the ice on for 15-20 minutes, 3-4 times a day, or as directed by your health care provider.  Only take over-the-counter or prescription medicines for pain, discomfort, or fever as directed by your caregiver. Your caregiver may recommend avoiding anti-inflammatory medicines (aspirin, ibuprofen, and naproxen) for 48 hours because these medicines may increase bruising.  Rest the injured area.  If possible, elevate the injured area to reduce swelling. SEEK IMMEDIATE MEDICAL CARE IF:   You have increased bruising or swelling.  You have pain that is getting worse.  Your swelling or pain is not relieved with medicines. MAKE SURE YOU:    Understand these instructions.  Will watch your condition.  Will get help right away if you are not doing well or get worse. Document Released: 02/12/2005 Document Revised: 05/10/2013 Document Reviewed: 03/10/2011 [facility] Patient Information 2015 [place], [place]. This information is not intended to replace advice given to you by your health care provider. Make sure you discuss any questions you have with your health care provider. Rotator Cuff Injury Rotator cuff injury is any type of injury to the set of muscles and tendons that make up the stabilizing unit of your shoulder. This unit holds the ball of your upper arm bone (humerus) in the socket of your shoulder blade (scapula).  CAUSES Injuries to your rotator cuff most commonly come from sports or activities that cause your arm to be moved repeatedly over your head. Examples of this include throwing, weight lifting, swimming, or racquet sports. Long lasting (chronic) irritation of your rotator cuff can cause soreness and swelling (inflammation), bursitis, and eventual damage to your tendons, such as a tear (rupture). SIGNS AND SYMPTOMS Acute rotator cuff tear:  Sudden tearing sensation followed by severe pain shooting from your upper shoulder down your arm toward your elbow.  Decreased range of motion of your shoulder because of pain and muscle spasm.  Severe pain.  Inability to raise your arm out to the side because of pain and loss of muscle power (large tears). Chronic rotator cuff tear:  Pain that usually is worse at night and may interfere with sleep.  Gradual weakness and decreased shoulder motion as the  pain worsens.  Decreased range of motion. Rotator cuff tendinitis:  Deep ache in your shoulder and the outside upper arm over your shoulder.  Pain that comes on gradually and becomes worse when lifting your arm to the side or turning it inward. DIAGNOSIS Rotator cuff injury is diagnosed through a medical history,  physical exam, and imaging exam. The medical history helps determine the type of rotator cuff injury. Your health care provider will look at your injured shoulder, feel the injured area, and ask you to move your shoulder in different positions. X-ray exams typically are done to rule out other causes of shoulder pain, such as fractures. MRI is the exam of choice for the most severe shoulder injuries because the images show muscles and tendons.  TREATMENT  Chronic tear:  Medicine for pain, such as acetaminophen or ibuprofen.  Physical therapy and range-of-motion exercises may be helpful in maintaining shoulder function and strength.  Steroid injections into your shoulder joint.  Surgical repair of the rotator cuff if the injury does not heal with noninvasive treatment. Acute tear:  Anti-inflammatory medicines such as ibuprofen and naproxen to help reduce pain and swelling.  A sling to help support your arm and rest your rotator cuff muscles. Long-term use of a sling is not advised. It may cause significant stiffening of the shoulder joint.  Surgery may be considered within a few weeks, especially in younger, active people, to return the shoulder to full function.  Indications for surgical treatment include the following:  Age younger than 60 years.  Rotator cuff tears that are complete.  Physical therapy, rest, and anti-inflammatory medicines have been used for 6-8 weeks, with no improvement.  Employment or sporting activity that requires constant shoulder use. Tendinitis:  Anti-inflammatory medicines such as ibuprofen and naproxen to help reduce pain and swelling.  A sling to help support your arm and rest your rotator cuff muscles. Long-term use of a sling is not advised. It may cause significant stiffening of the shoulder joint.  Severe tendinitis may require:  Steroid injections into your shoulder joint.  Physical therapy.  Surgery. HOME CARE INSTRUCTIONS   Apply ice to  your injury:  Put ice in a plastic bag.  Place a towel between your skin and the bag.  Leave the ice on for 20 minutes, 2-3 times a day.  If you have a shoulder immobilizer (sling and straps), wear it until told otherwise by your health care provider.  You may want to sleep on several pillows or in a recliner at night to lessen swelling and pain.  Only take over-the-counter or prescription medicines for pain, discomfort, or fever as directed by your health care provider.  Do simple hand squeezing exercises with a soft rubber ball to decrease hand swelling. SEEK MEDICAL CARE IF:   Your shoulder pain increases, or new pain or numbness develops in your arm, hand, or fingers.  Your hand or fingers are colder than your other hand. SEEK IMMEDIATE MEDICAL CARE IF:   Your arm, hand, or fingers are numb or tingling.  Your arm, hand, or fingers are increasingly swollen and painful, or they turn white or blue. MAKE SURE YOU:  Understand these instructions.  Will watch your condition.  Will get help right away if you are not doing well or get worse. Document Released: 05/02/2000 Document Revised: 05/10/2013 Document Reviewed: 12/15/2012 [facility] Patient Information 2015 [place], [place]. This information is not intended to replace advice given to you by your health care provider. Make sure  you discuss any questions you have with your health care provider. ° °

## 2014-11-12 NOTE — ED Notes (Signed)
To x-ray

## 2014-11-14 ENCOUNTER — Institutional Professional Consult (permissible substitution): Payer: Self-pay | Admitting: Medical

## 2016-11-07 IMAGING — CR DG SHOULDER 2+V*L*
3 series · 3 of 3 positions shown · non-contrast
Comparison: None.

CLINICAL DATA: Status post fall 4 days ago, with left shoulder
pain. Initial encounter.

EXAM:
LEFT SHOULDER - 2+ VIEW

[shoulder grashey]
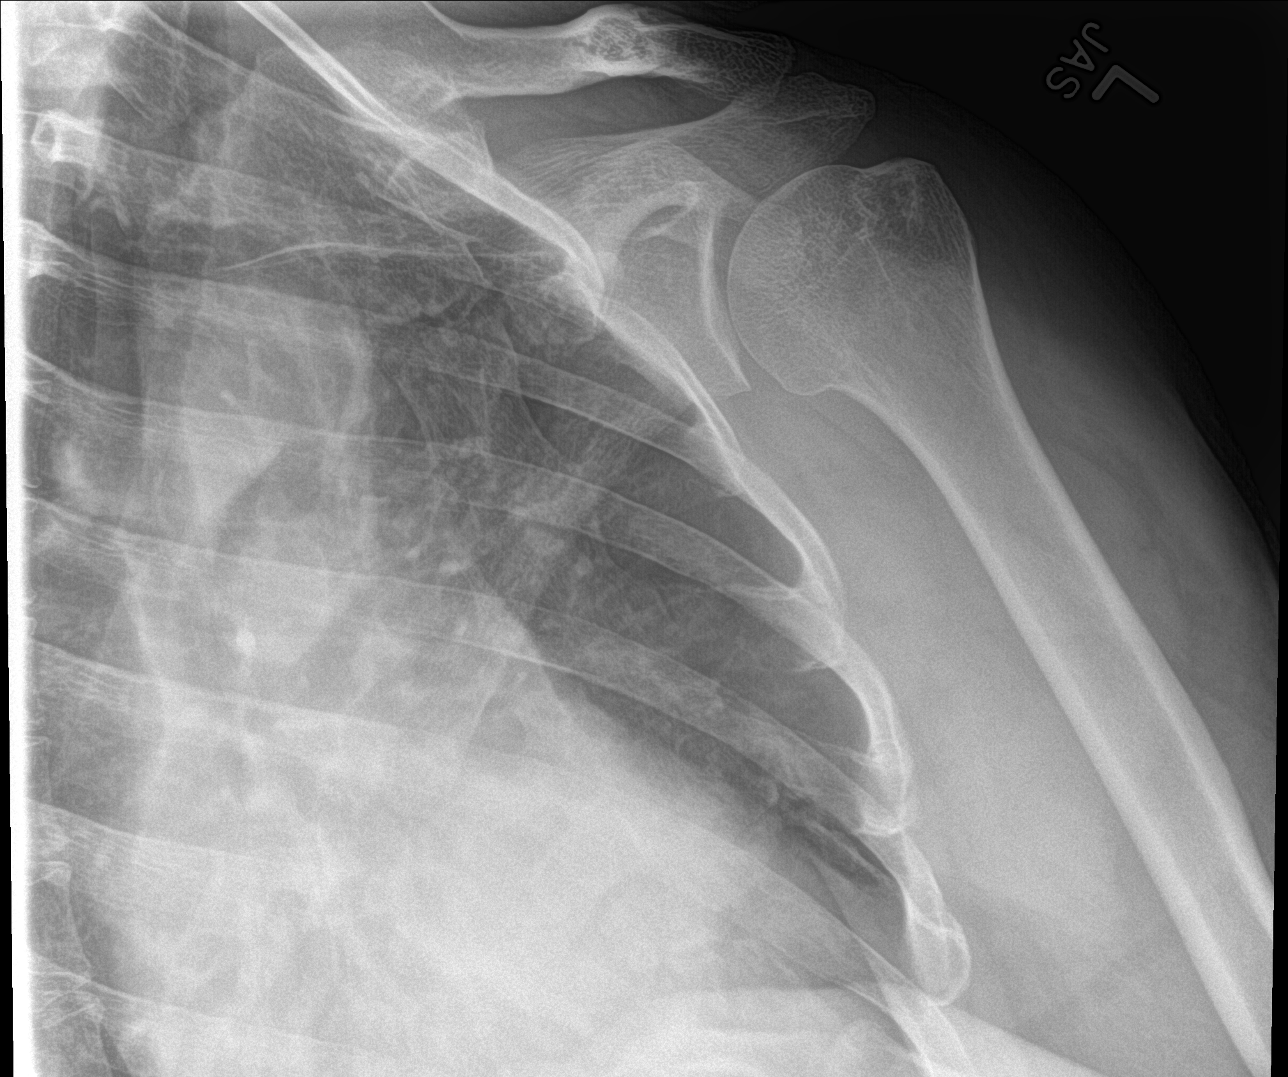

[shoulder y view]
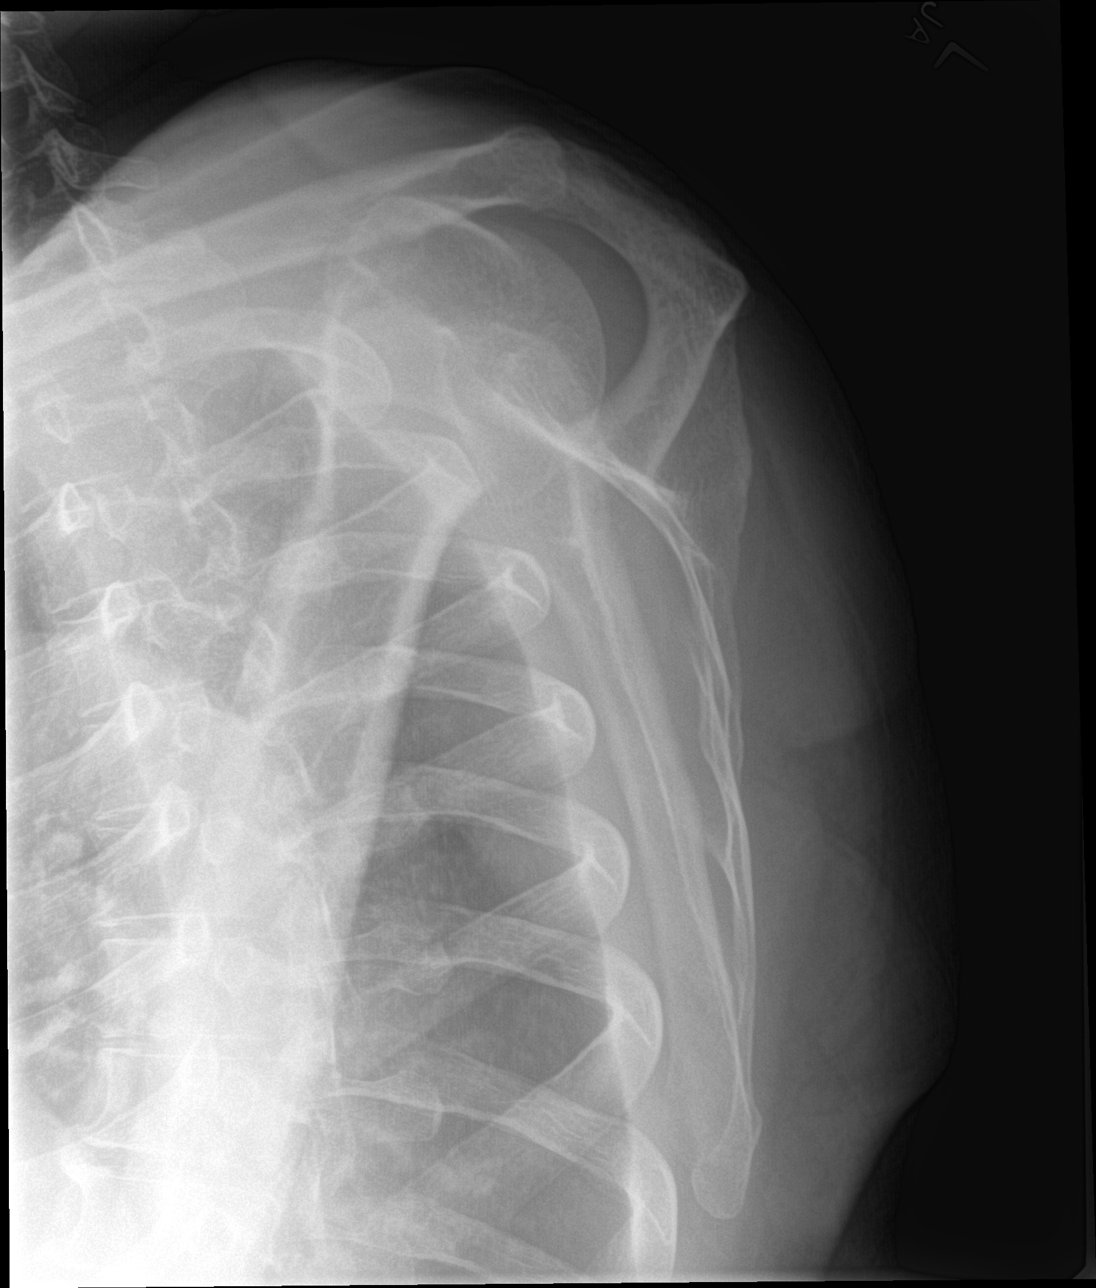

[shoulder axillary]
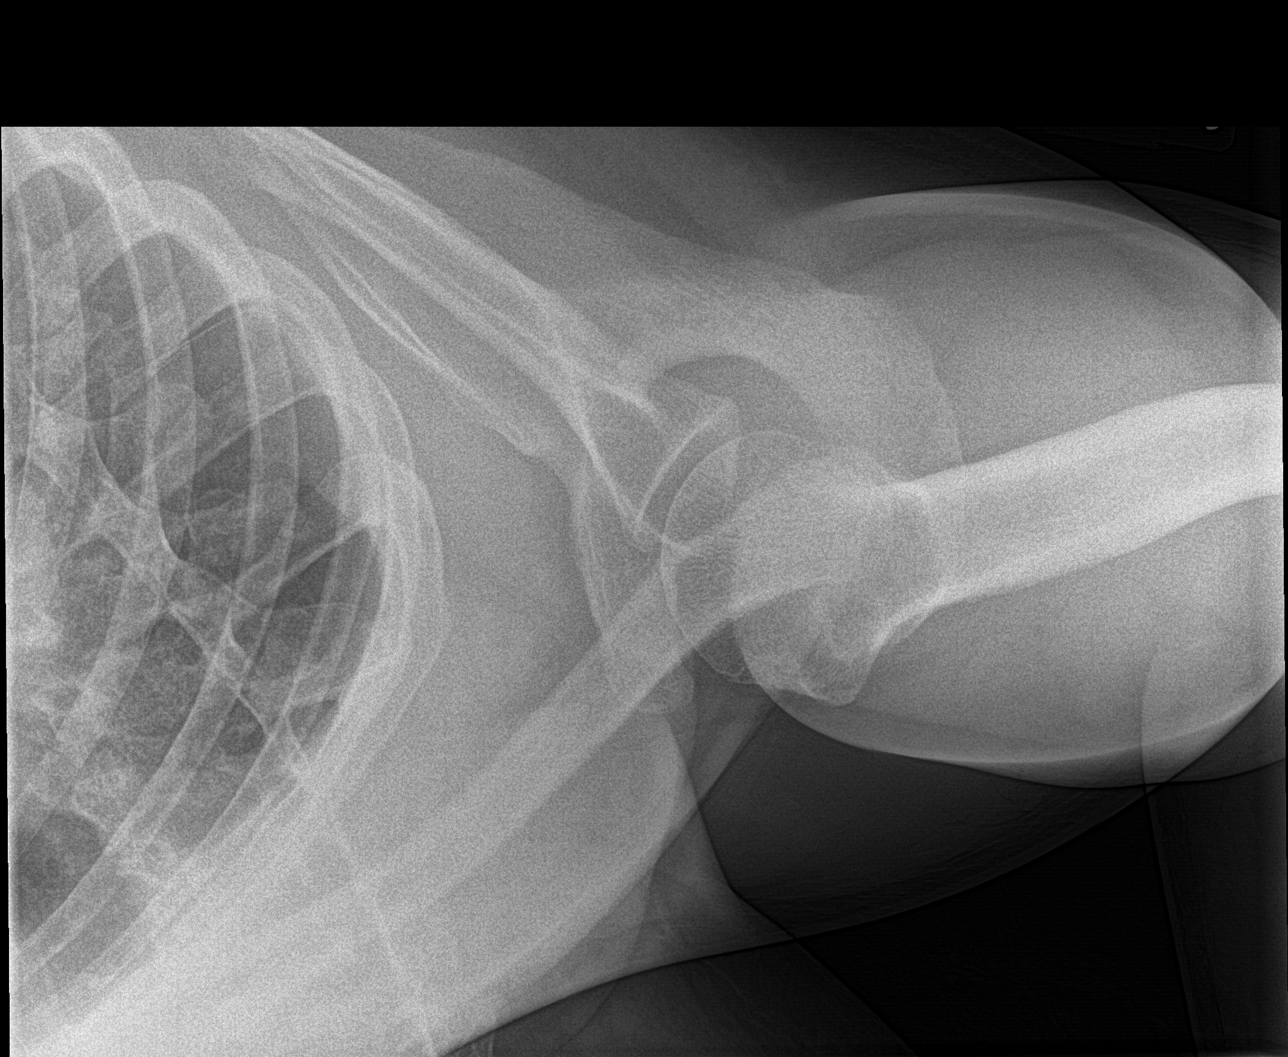

[3 of 3 positions shown; findings below may reference images not displayed]

FINDINGS: There is no evidence of fracture or dislocation. The left humeral
head is seated within the glenoid fossa. The acromioclavicular joint
is unremarkable in appearance. No significant soft tissue
abnormalities are seen. The visualized portions of the left lung are
clear.
IMPRESSION: No evidence of fracture or dislocation.

## 2019-07-13 ENCOUNTER — Ambulatory Visit: Payer: No Typology Code available for payment source | Attending: Internal Medicine

## 2019-07-13 DIAGNOSIS — Z20822 Contact with and (suspected) exposure to covid-19: Secondary | ICD-10-CM

## 2019-07-14 LAB — NOVEL CORONAVIRUS, NAA: SARS-CoV-2, NAA: NOT DETECTED

## 2020-07-03 ENCOUNTER — Other Ambulatory Visit: Payer: Self-pay

## 2020-07-03 ENCOUNTER — Emergency Department (HOSPITAL_COMMUNITY)
Admission: EM | Admit: 2020-07-03 | Discharge: 2020-07-03 | Disposition: A | Discharge status: Home / Self Care | Payer: Self-pay | Attending: Emergency Medicine | Admitting: Emergency Medicine

## 2020-07-03 ENCOUNTER — Emergency Department (HOSPITAL_BASED_OUTPATIENT_CLINIC_OR_DEPARTMENT_OTHER): Payer: Self-pay

## 2020-07-03 ENCOUNTER — Ambulatory Visit (HOSPITAL_COMMUNITY)
Admission: EM | Admit: 2020-07-03 | Discharge: 2020-07-03 | Disposition: A | Discharge status: Home / Self Care | Payer: No Typology Code available for payment source

## 2020-07-03 ENCOUNTER — Encounter (HOSPITAL_COMMUNITY): Payer: Self-pay | Admitting: Emergency Medicine

## 2020-07-03 DIAGNOSIS — M5431 Sciatica, right side: Secondary | ICD-10-CM

## 2020-07-03 DIAGNOSIS — K625 Hemorrhage of anus and rectum: Secondary | ICD-10-CM

## 2020-07-03 DIAGNOSIS — M79604 Pain in right leg: Secondary | ICD-10-CM

## 2020-07-03 DIAGNOSIS — R0989 Other specified symptoms and signs involving the circulatory and respiratory systems: Secondary | ICD-10-CM

## 2020-07-03 DIAGNOSIS — Z79899 Other long term (current) drug therapy: Secondary | ICD-10-CM | POA: Insufficient documentation

## 2020-07-03 DIAGNOSIS — K921 Melena: Secondary | ICD-10-CM | POA: Insufficient documentation

## 2020-07-03 DIAGNOSIS — I10 Essential (primary) hypertension: Secondary | ICD-10-CM

## 2020-07-03 DIAGNOSIS — M7989 Other specified soft tissue disorders: Secondary | ICD-10-CM

## 2020-07-03 DIAGNOSIS — F17210 Nicotine dependence, cigarettes, uncomplicated: Secondary | ICD-10-CM | POA: Insufficient documentation

## 2020-07-03 DIAGNOSIS — R609 Edema, unspecified: Secondary | ICD-10-CM

## 2020-07-03 DIAGNOSIS — R0602 Shortness of breath: Secondary | ICD-10-CM

## 2020-07-03 DIAGNOSIS — L03115 Cellulitis of right lower limb: Secondary | ICD-10-CM | POA: Insufficient documentation

## 2020-07-03 LAB — BASIC METABOLIC PANEL
Anion gap: 6 (ref 5–15)
BUN: 13 mg/dL (ref 6–20)
CO2: 25 mmol/L (ref 22–32)
Calcium: 9.5 mg/dL (ref 8.9–10.3)
Chloride: 106 mmol/L (ref 98–111)
Creatinine, Ser: 0.94 mg/dL (ref 0.61–1.24)
GFR, Estimated: >60 mL/min (ref >60)
Glucose, Bld: 105 mg/dL — ABNORMAL HIGH (ref 70–99)
Potassium: 4.4 mmol/L (ref 3.5–5.1)
Sodium: 137 mmol/L (ref 135–145)

## 2020-07-03 LAB — CBC WITH DIFFERENTIAL/PLATELET
Abs Immature Granulocytes: 0.01 10*3/uL (ref 0.00–0.07)
Basophils Absolute: 0 10*3/uL (ref 0.0–0.1)
Basophils Relative: 0 %
Eosinophils Absolute: 0.6 10*3/uL — ABNORMAL HIGH (ref 0.0–0.5)
Eosinophils Relative: 9 %
HCT: 42.1 % (ref 39.0–52.0)
Hemoglobin: 13.7 g/dL (ref 13.0–17.0)
Immature Granulocytes: 0 %
Lymphocytes Relative: 29 %
Lymphs Abs: 2 10*3/uL (ref 0.7–4.0)
MCH: 31.1 pg (ref 26.0–34.0)
MCHC: 32.5 g/dL (ref 30.0–36.0)
MCV: 95.5 fL (ref 80.0–100.0)
Monocytes Absolute: 0.7 10*3/uL (ref 0.1–1.0)
Monocytes Relative: 11 %
Neutro Abs: 3.4 10*3/uL (ref 1.7–7.7)
Neutrophils Relative %: 51 %
Platelets: 258 10*3/uL (ref 150–400)
RBC: 4.41 MIL/uL (ref 4.22–5.81)
RDW: 13.8 % (ref 11.5–15.5)
WBC: 6.8 10*3/uL (ref 4.0–10.5)
nRBC: 0 % (ref 0.0–0.2)

## 2020-07-03 LAB — POC OCCULT BLOOD, ED: Fecal Occult Bld: NEGATIVE

## 2020-07-03 MED ORDER — OXYCODONE-ACETAMINOPHEN 5-325 MG PO TABS
1.0000 | ORAL_TABLET | Freq: Once | ORAL | Status: AC
Start: 2020-07-03 — End: 2020-07-03
  Administered 2020-07-03: 1 via ORAL
  Filled 2020-07-03: qty 1

## 2020-07-03 MED ORDER — AMLODIPINE BESYLATE 5 MG PO TABS: 5.0000 mg | ORAL_TABLET | Freq: Every day | ORAL | 0 refills | Status: DC

## 2020-07-03 MED ORDER — AMLODIPINE BESYLATE 5 MG PO TABS
5.0000 mg | ORAL_TABLET | Freq: Once | ORAL | Status: AC
  Administered 2020-07-03: 5 mg via ORAL
  Filled 2020-07-03: qty 1

## 2020-07-03 MED ORDER — CEPHALEXIN 500 MG PO CAPS: 500.0000 mg | ORAL_CAPSULE | Freq: Four times a day (QID) | ORAL | 0 refills | Status: AC

## 2020-07-03 NOTE — ED Provider Notes (Signed)
[facility]    CSN: [id number] Arrival date & time: 07/03/20  1222      History   Chief Complaint Chief Complaint  Patient presents with  . Leg Swelling    HPI Jacob Soto is a 55 y.o. male presenting with multiple complaints.  History of hypertension, on no medications for this. -Today presenting with 4 days of right foot and leg swelling and pain.  Also endorses shortness of breath which is new.  Pain is worse in the calf.  States he does not have swelling at baseline. Denies recent travel, prolonged immobilization, surgeries, denies history of blood clots in the past. Denies trauma. -Radiation of pain from right hip down right thigh. denies back pain, trauma.  Has been told in the past that he has sciatica. -Also mentions BRBPR intermittently for the last few weeks.  Denies abdominal pain, changes in bowel movements, urinary symptoms. -History of hypertension, on no medications for this.  Denies chest pain, headaches, vision changes.  HPI  Past Medical History:  Diagnosis Date  . Hypertension     There are no problems to display for this patient.   No past surgical history on file.     Home Medications    Prior to Admission medications   Medication Sig Start Date End Date Taking? Authorizing Provider  cyclobenzaprine (FLEXERIL) 5 MG tablet Take 1 tablet (5 mg total) by mouth 3 (three) times daily as needed for muscle spasms. 09/12/13   Rubye Oaks, Janene Harvey, PA-C  ibuprofen (ADVIL,MOTRIN) 600 MG tablet Take 1 tablet (600 mg total) by mouth every 6 (six) hours as needed. 11/12/14   Derwood Kaplan, MD  naphazoline-pheniramine (NAPHCON-A) 0.025-0.3 % ophthalmic solution Place 1 drop into both eyes 4 (four) times daily as needed. For dry eyes    [provider]  naproxen (NAPROSYN) 500 MG tablet Take 1 tablet (500 mg total) by mouth 2 (two) times daily with a meal. 09/12/13   Palmer, Janene Harvey, PA-C  Pseudoephedrine HCl (SINUS & ALLERGY 12 HOUR PO) Take 1  tablet by mouth 2 (two) times daily as needed. For sinus congestion    [provider]  traMADol (ULTRAM) 50 MG tablet Take 1 tablet (50 mg total) by mouth every 6 (six) hours as needed. 11/12/14   Derwood Kaplan, MD    Family History No family history on file.  Social History Social History   Tobacco Use  . Smoking status: Current Every Day Smoker    Types: Cigarettes  Substance Use Topics  . Alcohol use: Yes    Comment: occassional  . Drug use: Yes    Types: Marijuana     Allergies   Patient has no known allergies.   Review of Systems Review of Systems  Constitutional: Negative for appetite change, chills and fever.  HENT: Negative for congestion, ear pain, rhinorrhea, sinus pressure, sinus pain and sore throat.   Eyes: Negative for redness and visual disturbance.  Respiratory: Positive for shortness of breath. Negative for cough, chest tightness and wheezing.   Cardiovascular: Negative for chest pain and palpitations.  Gastrointestinal: Positive for blood in stool. Negative for abdominal pain, constipation, diarrhea, nausea and vomiting.  Genitourinary: Negative for dysuria, frequency and urgency.  Musculoskeletal: Negative for myalgias.       Right calf swelling  Neurological: Negative for dizziness, weakness and headaches.  Psychiatric/Behavioral: Negative for confusion.  All other systems reviewed and are negative.    Physical Exam Triage Vital Signs ED Triage Vitals  Enc  Vitals Group     BP 07/03/20 1250 (!) 172/104     Pulse Rate 07/03/20 1250 75     Resp 07/03/20 1250 18     Temp 07/03/20 1250 98.6 F (37 C)     Temp src --      SpO2 07/03/20 1250 97 %     Weight --      Height --      Head Circumference --      Peak Flow --      Pain Score 07/03/20 1248 7     Pain Loc --      Pain Edu? --      Excl. in GC? --    No data found.  Updated Vital Signs BP (!) 133/102 (BP Location: Right Arm)   Pulse 75   Temp 98.6 F (37 C)   Resp 18    SpO2 97%   Visual Acuity Right Eye Distance:   Left Eye Distance:   Bilateral Distance:    Right Eye Near:   Left Eye Near:    Bilateral Near:     Physical Exam Vitals reviewed.  Constitutional:      General: He is not in acute distress.    Appearance: Normal appearance. He is not ill-appearing.  HENT:     Head: Normocephalic and atraumatic.  Cardiovascular:     Rate and Rhythm: Normal rate and regular rhythm.     Heart sounds: Normal heart sounds.     Comments: Positive homan sign R calf Pulmonary:     Effort: Pulmonary effort is normal.     Breath sounds: Normal breath sounds and air entry.  Abdominal:     Tenderness: There is no abdominal tenderness. There is no right CVA tenderness, left CVA tenderness, guarding or rebound.     Comments: No bowel or bladder incontinence.  Musculoskeletal:     Cervical back: Normal range of motion. No swelling, deformity, signs of trauma, rigidity, spasms, tenderness, bony tenderness or crepitus. No pain with movement.     Thoracic back: No swelling, deformity, signs of trauma, spasms, tenderness or bony tenderness. Normal range of motion. No scoliosis.     Lumbar back: No swelling, deformity, signs of trauma, spasms, tenderness or bony tenderness. Normal range of motion. Negative right straight leg raise test and negative left straight leg raise test. No scoliosis.     Right lower leg: 2+ Edema present.     Comments: Strength 5/5 in UEs and LEs.  Skin:    Comments: Toenails thick   Neurological:     General: No focal deficit present.     Mental Status: He is alert.     Cranial Nerves: No cranial nerve deficit.     Comments: CN 2-12 grossly intact  Psychiatric:        Mood and Affect: Mood normal.        Behavior: Behavior normal.        Thought Content: Thought content normal.        Judgment: Judgment normal.      UC Treatments / Results  Labs (all labs ordered are listed, but only abnormal results are displayed) Labs  Reviewed - No data to display  EKG   Radiology No results found.  Procedures Procedures (including critical care time)  Medications Ordered in UC Medications - No data to display  Initial Impression / Assessment and Plan / UC Course  I have reviewed the triage vital signs and the nursing notes.  Pertinent labs & imaging results that were available during my care of the patient were reviewed by me and considered in my medical decision making (see chart for details).     This patient is a 55 year old male presenting with right calf pain and swelling, positive Homan sign, and shortness of breath. Vital signs fairly normal- oxygenating well on room air, no wheezes rhonchi or rales.   He also notes new BRBPR intermittently for the last 3 weeks.   Patient also with right-sided sciatica exacerbation.   Essential hypertension borderline on no medication. He does not have a PCP.   I am sending this patient to [facility] ER for further evaluation of possible DVT/PE.   Spent over 60 minutes obtaining H&P, performing physical, discussing results, treatment plan and plan for follow-up with patient. Patient agrees with plan.   This chart was dictated using voice recognition software, Dragon. Despite the best efforts of this provider to proofread and correct errors, errors may still occur which can change documentation meaning.   Final Clinical Impressions(s) / UC Diagnoses   Final diagnoses:  Suspected deep vein thrombosis (DVT)  Shortness of breath  BRBPR (bright red blood per rectum)  Sciatica of right side  Essential hypertension     Discharge Instructions     Head straight to [facility] ED for DVT (blood clot) evaluation.   If you develop chest pain, dizziness, worsening of shortness of breath, weakness, headaches, etc. while on the way to the ED- immediately call EMS.     ED Prescriptions    None     PDMP not reviewed this encounter.   Rhys Martini,  PA-C 07/03/20 1329

## 2020-07-03 NOTE — ED Triage Notes (Signed)
C/o R lower leg pain and swelling x 3-4 days.  No known injury.  States pain is from knee to ankle.

## 2020-07-03 NOTE — Discharge Instructions (Addendum)
You came to the emergency department to have your right lower leg swelling and pain assessed.  The ultrasound of your left lower leg showed no blood clots.  To the swelling, pain, warmth and it is associated with your right lower leg I have given you antibiotics to treat for a cellulitis infection.  While in the hospital you were noted to be high blood pressure.  I prescribed you Norvasc 5 mg.  Please take 1 pill once a day.  Please follow-up with the [place] [facility] for further management of your hypertension.  You also were concerned about rectal bleeding.  I did not see any rectal bleeding on your rectal exam.  I did not see any signs of hemorrhoids or anal fissures.  Please follow-up with your primary care provider if this problem persists.    Today you received medications that may make you sleepy or impair your ability to make decisions.  For the next 24 hours please do not drive, operate heavy machinery, care for a small child with out another adult present, or perform any activities that may cause harm to you or someone else if you were to fall asleep or be impaired.   You may have diarrhea from the antibiotics.  It is very important that you continue to take the antibiotics even if you get diarrhea unless a medical professional tells you that you may stop taking them.  If you stop too early the bacteria you are being treated for will become stronger and you may need different, more powerful antibiotics that have more side effects and worsening diarrhea.  Please stay well hydrated and consider probiotics as they may decrease the severity of your diarrhea.    Get help right away if: Your symptoms get worse. You feel very sleepy. You develop vomiting or diarrhea that persists. You notice red streaks coming from the infected area. Your red area gets larger or turns dark in color.

## 2020-07-03 NOTE — ED Provider Notes (Signed)
[facility] EMERGENCY DEPARTMENT Provider Note   CSN: [id number] Arrival date & time: 07/03/20  1405     History Chief Complaint  Patient presents with  . Leg Pain    Jacob Soto is a 55 y.o. male with a past medical history of hypertension currently not on any medication.  Patient presents with a chief complaint of right lower leg pain and swelling.  Patient reports that he has had swelling and pain in this limb intermittently for the last few months.  However, for the last 4 days the swelling and pain have been constant.  Patient rates his pain as a 7/10 on the pain scale.  Patient pain is worse with moving or walking.   Patient's pain is improved with rest.   Patient denies any falls or traumatic injuries.Patient denies any recent surgery, history of DVT or PE, hormone therapy, prolonged immobilization, cancer treatment, hemoptysis.  Patient denies any fevers, chills, chest pain.    Patient also complains of blood in stools.  Patient reports this has been occurring intermittently over the past year.  Bloody stools are associated with rectal pain with defecation.  Patient states that bleeding is bright red blood seen in stool and when wiping.  Patient is unable to quantify the amount of blood.  Patient denies any bleeding in the last 4 days.  Patient denies any melena.  Patient denies any abdominal pain, nausea, vomiting, constipation, diarrhea, lightheadedness, dizziness, syncopal episodes, fatigue.  Patient denies any receptive anal sex.  HPI     Past Medical History:  Diagnosis Date  . Hypertension     There are no problems to display for this patient.   History reviewed. No pertinent surgical history.     No family history on file.  Social History   Tobacco Use  . Smoking status: Current Every Day Smoker    Types: Cigarettes  Substance Use Topics  . Alcohol use: Yes    Comment: occassional  . Drug use: Yes    Types: Marijuana    Home  Medications Prior to Admission medications   Medication Sig Start Date End Date Taking? Authorizing Provider  amLODipine (NORVASC) 5 MG tablet Take 1 tablet (5 mg total) by mouth daily. 07/03/20  Yes Haskel Schroeder, PA-C  cephALEXin (KEFLEX) 500 MG capsule Take 1 capsule (500 mg total) by mouth 4 (four) times daily for 7 days. 07/03/20 07/10/20 Yes Ebelyn Bohnet, Rose Phi, PA-C  cyclobenzaprine (FLEXERIL) 5 MG tablet Take 1 tablet (5 mg total) by mouth 3 (three) times daily as needed for muscle spasms. 09/12/13   Rubye Oaks, Janene Harvey, PA-C  ibuprofen (ADVIL,MOTRIN) 600 MG tablet Take 1 tablet (600 mg total) by mouth every 6 (six) hours as needed. 11/12/14   Derwood Kaplan, MD  naphazoline-pheniramine (NAPHCON-A) 0.025-0.3 % ophthalmic solution Place 1 drop into both eyes 4 (four) times daily as needed. For dry eyes    [provider]  naproxen (NAPROSYN) 500 MG tablet Take 1 tablet (500 mg total) by mouth 2 (two) times daily with a meal. 09/12/13   Palmer, Janene Harvey, PA-C  Pseudoephedrine HCl (SINUS & ALLERGY 12 HOUR PO) Take 1 tablet by mouth 2 (two) times daily as needed. For sinus congestion    [provider]  traMADol (ULTRAM) 50 MG tablet Take 1 tablet (50 mg total) by mouth every 6 (six) hours as needed. 11/12/14   Derwood Kaplan, MD    Allergies    Patient has no known allergies.  Review  of Systems   Review of Systems  Constitutional: Negative for chills and fever.  Eyes: Negative for visual disturbance.  Respiratory: Negative for cough and shortness of breath.   Cardiovascular: Positive for leg swelling. Negative for chest pain.  Gastrointestinal: Positive for blood in stool. Negative for abdominal distention, abdominal pain, anal bleeding, constipation, diarrhea, nausea, rectal pain and vomiting.  Genitourinary: Negative for difficulty urinating and dysuria.  Musculoskeletal: Negative for back pain and neck pain.  Skin: Negative for color change and rash.   Neurological: Negative for dizziness, syncope, light-headedness and headaches.  Psychiatric/Behavioral: Negative for confusion.    Physical Exam Updated Vital Signs BP (!) 205/99   Pulse 63   Temp 97.8 F (36.6 C) (Oral)   Resp 20   SpO2 98%   Physical Exam Vitals and nursing note reviewed. Exam conducted with a chaperone present Tax adviser present as chaperone).  Constitutional:      General: He is not in acute distress.    Appearance: He is not ill-appearing, toxic-appearing or diaphoretic.  HENT:     Head: Normocephalic.  Eyes:     General: No scleral icterus.       Right eye: No discharge.        Left eye: No discharge.  Cardiovascular:     Rate and Rhythm: Normal rate.     Pulses:          Dorsalis pedis pulses are 3+ on the right side.  Pulmonary:     Effort: Pulmonary effort is normal. No respiratory distress.     Breath sounds: No stridor. No wheezing, rhonchi or rales.  Chest:     Chest wall: No tenderness.  Abdominal:     General: There is no distension. There are no signs of injury.     Palpations: Abdomen is soft. There is no mass or pulsatile mass.     Tenderness: There is no abdominal tenderness. There is no guarding or rebound.  Genitourinary:    Rectum: Guaiac result negative. No mass, tenderness, anal fissure, external hemorrhoid or internal hemorrhoid. Normal anal tone.     Comments: No blood or melena noted on gloved hand after rectal exam Musculoskeletal:     Cervical back: Neck supple.     Right lower leg: Tenderness present. No deformity, lacerations or bony tenderness. 2+ Edema present.     Left lower leg: No swelling, deformity, lacerations, tenderness or bony tenderness. No edema.     Right ankle: Swelling present. No deformity, ecchymosis or lacerations. No tenderness. Normal range of motion.     Right foot: Normal range of motion and normal capillary refill. No swelling, deformity, laceration, tenderness, bony tenderness or crepitus. Normal  pulse.     Comments: Erythema noted to right lower extremity  Feet:     Right foot:     Toenail Condition: Right toenails are abnormally thick.     Left foot:     Toenail Condition: Left toenails are abnormally thick.  Skin:    General: Skin is warm and dry.  Neurological:     General: No focal deficit present.     Mental Status: He is alert.     GCS: GCS eye subscore is 4. GCS verbal subscore is 5. GCS motor subscore is 6.  Psychiatric:        Behavior: Behavior is cooperative.     ED Results / Procedures / Treatments   Labs (all labs ordered are listed, but only abnormal results are displayed) Labs Reviewed  BASIC METABOLIC PANEL - Abnormal; Notable for the following components:      Result Value   Glucose, Bld 105 (*)    All other components within normal limits  CBC WITH DIFFERENTIAL/PLATELET - Abnormal; Notable for the following components:   Eosinophils Absolute 0.6 (*)    All other components within normal limits  POC OCCULT BLOOD, ED    EKG None  Radiology VAS [place] LOWER EXTREMITY VENOUS (DVT) (ONLY [facility] & [facility])  Result Date: 07/03/2020  Lower Venous DVT Study Indications: Swelling, and Edema.  Comparison Study: no prior Performing Technologist: Blanch Media RVS  Examination Guidelines: A complete evaluation includes B-mode imaging, spectral Doppler, color Doppler, and power Doppler as needed of all accessible portions of each vessel. Bilateral testing is considered an integral part of a complete examination. Limited examinations for reoccurring indications may be performed as noted. The reflux portion of the exam is performed with the patient in reverse Trendelenburg.  +---------+---------------+---------+-----------+----------+-------------------+ RIGHT    CompressibilityPhasicitySpontaneityPropertiesThrombus Aging      +---------+---------------+---------+-----------+----------+-------------------+ CFV      Full           Yes      Yes                                       +---------+---------------+---------+-----------+----------+-------------------+ SFJ      Full                                                             +---------+---------------+---------+-----------+----------+-------------------+ FV Prox  Full                                                             +---------+---------------+---------+-----------+----------+-------------------+ FV Mid   Full                                                             +---------+---------------+---------+-----------+----------+-------------------+ FV DistalFull                                                             +---------+---------------+---------+-----------+----------+-------------------+ PFV      Full                                                             +---------+---------------+---------+-----------+----------+-------------------+ POP      Full           Yes      Yes                                      +---------+---------------+---------+-----------+----------+-------------------+  PTV      Full                                                             +---------+---------------+---------+-----------+----------+-------------------+ PERO                                                  Not well visualized +---------+---------------+---------+-----------+----------+-------------------+   +----+---------------+---------+-----------+----------+-------------------+ LEFTCompressibilityPhasicitySpontaneityPropertiesThrombus Aging      +----+---------------+---------+-----------+----------+-------------------+ CFV                                              Not well visualized +----+---------------+---------+-----------+----------+-------------------+     Summary: RIGHT: - There is no evidence of deep vein thrombosis in the lower extremity.  - No cystic structure found in the popliteal fossa.   *See table(s) above for  measurements and observations.    Preliminary     Procedures Procedures   Medications Ordered in ED Medications  oxyCODONE-acetaminophen (PERCOCET/ROXICET) 5-325 MG per tablet 1 tablet (1 tablet Oral Given 07/03/20 1757)  amLODipine (NORVASC) tablet 5 mg (5 mg Oral Given 07/03/20 1903)    ED Course  I have reviewed the triage vital signs and the nursing notes.  Pertinent labs & imaging results that were available during my care of the patient were reviewed by me and considered in my medical decision making (see chart for details).    MDM Rules/Calculators/A&P                          Alert 55 year old male no acute distress, nontoxic appearing.  Patient is sent from urgent care for DVT evaluation.  Patient complains of pain and swelling to right lower extremity.  Symptoms have been intermittent for the past 3 months however constant over the last 4 days.  Patient denies any traumatic injuries or falls.  No history of DVT/PE, recent surgery, cancer treatment, hemoptysis, prolonged immobilization, hormone therapy.  Patient's right lower extremity noted to have 2+ edema, tenderness to palpation, erythema and warmth.  +3 dorsalis pedis pulse and right foot.  Ultrasound of right lower extremity ordered to assess DVT.  No DVT or cystic structure noted.  Will treat patient for possible cellulitis with Keflex 4 times a day x7 days.  Patient also complained bright red blood in stools.  Patient reports has noticed this intermittently over the past year.  Patient reports bleeding is associated with pain on defecation.  No external or internal hemorrhoids, anal fissure, anal mass, tenderness, or abnormal anal tone noted.  Guaiac negative.  No blood or melena noted on gloved hand after rectal exam.  Hemoglobin and hematocrit within normal limits.  Patient's bright red blood likely due to hemorrhoids.  Patient given education on symptomatic care.  While in the emergency department patient was noted to be  hypertensive.  Patient has history of hypertension but denies any current medications.  Patient denies a primary care provider.  Patient has no headache, visual disturbance, nausea, vomiting, or focal neurological deficits.  Patient was given dose  of Norvasc.  Patient given information to follow-up with [facility] for management of his hypertension.  BMP within normal limits.  Discussed results, findings, treatment and follow up. Patient advised of return precautions. Patient verbalized understanding and agreed with plan.   Final Clinical Impression(s) / ED Diagnoses Final diagnoses:  Cellulitis of right lower extremity  Right leg pain  Hypertension, unspecified type    Rx / DC Orders ED Discharge Orders         Ordered    amLODipine (NORVASC) 5 MG tablet  Daily        07/03/20 1829    cephALEXin (KEFLEX) 500 MG capsule  4 times daily        07/03/20 1829           Berneice Heinrich 07/03/20 2204    Derwood Kaplan, MD 07/04/20 [phone]

## 2020-07-03 NOTE — Progress Notes (Signed)
Right lower extremity venous study completed.    Please see CV Proc for preliminary results.   Blanch Media RVS tech performed exam

## 2020-07-03 NOTE — Discharge Instructions (Addendum)
Head straight to [facility] ED for DVT (blood clot) evaluation.   If you develop chest pain, dizziness, worsening of shortness of breath, weakness, headaches, etc. while on the way to the ED- immediately call EMS.

## 2020-07-03 NOTE — ED Notes (Signed)
Pt was informed to head to the ED per provider for further evaluation. Pt understood and agreed.

## 2020-07-03 NOTE — Progress Notes (Signed)
Secure chat to Jacob Soto, [place] regarding consult for IV start.  Patient currently does not have order for IV insert or IV medications or fluids.  Jacob Soto states that she will enter orders for IV start.

## 2020-07-03 NOTE — ED Triage Notes (Signed)
Pt states that his right foot and leg started swelling four days ago. Pt states that he is experiencing numbness, tingling, and burning in his foot and leg. Pt denies any chest pain, back pain, or SOB.

## 2024-01-08 ENCOUNTER — Other Ambulatory Visit: Payer: Self-pay | Admitting: Internal Medicine

## 2024-02-06 ENCOUNTER — Other Ambulatory Visit: Payer: Self-pay

## 2024-02-06 ENCOUNTER — Emergency Department (HOSPITAL_COMMUNITY)
Admission: EM | Admit: 2024-02-06 | Discharge: 2024-02-06 | Disposition: A | Discharge status: Home / Self Care | Payer: Self-pay | Attending: Emergency Medicine | Admitting: Emergency Medicine

## 2024-02-06 ENCOUNTER — Emergency Department (HOSPITAL_COMMUNITY): Payer: Self-pay

## 2024-02-06 DIAGNOSIS — X509XXA Other and unspecified overexertion or strenuous movements or postures, initial encounter: Secondary | ICD-10-CM | POA: Insufficient documentation

## 2024-02-06 DIAGNOSIS — S46211A Strain of muscle, fascia and tendon of other parts of biceps, right arm, initial encounter: Secondary | ICD-10-CM | POA: Insufficient documentation

## 2024-02-06 MED ORDER — IBUPROFEN 800 MG PO TABS
800.0000 mg | ORAL_TABLET | Freq: Once | ORAL | Status: AC
  Administered 2024-02-06: 800 mg via ORAL
  Filled 2024-02-06: qty 1

## 2024-02-06 MED ORDER — IBUPROFEN 800 MG PO TABS: 800.0000 mg | ORAL_TABLET | Freq: Three times a day (TID) | ORAL | 0 refills | Status: AC

## 2024-02-06 NOTE — ED Triage Notes (Signed)
 Pt arrived via POV for shoulder pain after lifting a stove around 1pm today.  Pt report hearing a tearing noise and 10/10 pain, especially with movement.  +2 radial pulse.

## 2024-02-06 NOTE — ED Provider Notes (Signed)
 [place] EMERGENCY DEPARTMENT AT [facility] Provider Note   CSN: [id number] Arrival date & time: 02/06/24  1531     Patient presents with: Shoulder Injury   Jacob Soto is a 58 y.o. male.   HPI Patient reports that about 1 PM he was trying to help some friends move a stove and ended up hearing a pop in his right shoulder.  Reports after that he could not move it without a lot of pain.    Prior to Admission medications   Medication Sig Start Date End Date Taking? Authorizing Provider  ibuprofen  (ADVIL ) 800 MG tablet Take 1 tablet (800 mg total) by mouth 3 (three) times daily. 02/06/24  Yes Armenta Canning, MD  amLODipine  (NORVASC ) 5 MG tablet Take 1 tablet (5 mg total) by mouth daily. 07/03/20   Eudelia Maude SAUNDERS, PA-C  cyclobenzaprine  (FLEXERIL ) 5 MG tablet Take 1 tablet (5 mg total) by mouth 3 (three) times daily as needed for muscle spasms. 09/12/13   Cammie, Jessica K, PA-C  ibuprofen  (ADVIL ,MOTRIN ) 600 MG tablet Take 1 tablet (600 mg total) by mouth every 6 (six) hours as needed. 11/12/14   Charlyn Sora, MD  naphazoline-pheniramine (NAPHCON-A) 0.025-0.3 % ophthalmic solution Place 1 drop into both eyes 4 (four) times daily as needed. For dry eyes    [provider]  naproxen  (NAPROSYN ) 500 MG tablet Take 1 tablet (500 mg total) by mouth 2 (two) times daily with a meal. 09/12/13   Palmer, Jessica K, PA-C  Pseudoephedrine HCl (SINUS & ALLERGY 12 HOUR PO) Take 1 tablet by mouth 2 (two) times daily as needed. For sinus congestion    [provider]  traMADol  (ULTRAM ) 50 MG tablet Take 1 tablet (50 mg total) by mouth every 6 (six) hours as needed. 11/12/14   Charlyn Sora, MD    Allergies: Patient has no known allergies.    Review of Systems  Updated Vital Signs BP 126/87 (BP Location: Left Arm)   Pulse 81   Temp 98 F (36.7 C) (Oral)   Resp 16   SpO2 98%   Physical Exam Constitutional:      Comments: Alert nontoxic no respiratory distress.   Well-nourished well-developed.  HENT:     Mouth/Throat:     Pharynx: Oropharynx is clear.  Pulmonary:     Effort: Pulmonary effort is normal.  Musculoskeletal:     Comments: No appearance of deformity at the right shoulder.  Patient is maintaining it in a homemade sling.  Radial pulse 2+ and strong.  Patient can move the elbow and the wrist.  With assistance to flexion, decreased bicep strength.  Skin:    General: Skin is warm and dry.  Neurological:     General: No focal deficit present.     Mental Status: He is oriented to person, place, and time.     (all labs ordered are listed, but only abnormal results are displayed) Labs Reviewed - No data to display  EKG: None  Radiology: DG Shoulder Right Result Date: 02/06/2024 EXAM: 3 VIEW XRAY OF THE RIGHT SHOULDER 02/06/2024 07:20:00 PM COMPARISON: None available. CLINICAL HISTORY: Lifting injury. Pt arrived via POV for shoulder pain after lifting a stove around 1pm today. Pt reports hearing a tearing noise and 10/10 pain, especially with movement. FINDINGS: BONES AND JOINTS: Glenohumeral joint is normally aligned. No acute fracture or dislocation. The [facility] joint is unremarkable in appearance. SOFT TISSUES: No abnormal calcifications. Visualized lung is unremarkable. IMPRESSION: 1. No significant abnormality. Electronically  signed by: Pinkie Pebbles MD 02/06/2024 07:22 PM EDT RP Workstation: [id number]     Procedures   Medications Ordered in the ED  ibuprofen  (ADVIL ) tablet 800 mg (has no administration in time range)                                    Medical Decision Making Amount and/or Complexity of Data Reviewed Radiology: ordered.  Risk Prescription drug management.   Patient presents with mechanism history consistent with proximal biceps rupture.  Strays obtained and no fracture or dislocation noted.  Patient is neurovascularly intact.  Consult: Reviewed with Dr. Vassie, he advises to place Ace wrap from the  elbow up toward the shoulder to compress the biceps.  Patient can be seen at [facility] by Dr. Josefina Chancy or [place] this week.  Patient is to call in and schedule his appointment.  Patient made aware of potential surgical repair.  He will be evaluated orthopedics to determine best management.  I have counseled the patient on aced compression, elevating and pain control with ibuprofen  and Tylenol .  He voices understanding.  Work excuse provided for a week so patient can go to follow-up and establish care plan.     Final diagnoses:  Biceps rupture, proximal, right, initial encounter    ED Discharge Orders          Ordered    ibuprofen  (ADVIL ) 800 MG tablet  3 times daily        02/06/24 2018               Armenta Canning, MD 02/06/24 2033

## 2024-02-06 NOTE — Discharge Instructions (Signed)
 1.  You most likely have a partial or complete tear of your biceps tendon where it attaches at the shoulder.  Depending on the severity of the injury and amount of strength and mobility that you have after this injury, this may need a surgical repair to get your previous level of strength and flexibility back.  Call [facility]opedic specialist on Monday morning.  Included in your discharge instructions as a contact information for Dr. Sidra Olmsted.  He is a shoulder specialist at this clinic.  Also at the same clinic you might see Dr. Cristy or Dr. [facility] who will also do shoulder and arm repairs. 2.  Applying Ace wrap from the level of your elbow up towards the shoulder to compress the biceps and push it up towards the shoulder.  The wrap is too tight if your lower arm is tingly, bluish or numb.  Loosen the wrap.  Be sure to elevate the arm above the level of your heart is much as possible to decrease swelling. 3.  You may take ibuprofen  for pain control every 8 hours as prescribed.  You may add extra strength Tylenol  per package instructions every 6 hours for additional pain control.

## 2024-02-16 ENCOUNTER — Encounter (HOSPITAL_COMMUNITY): Payer: Self-pay | Admitting: Internal Medicine

## 2024-02-16 NOTE — Progress Notes (Signed)
 Attempted to obtain medical history for pre op call via telephone, unable to reach at this time. HIPAA compliant voicemail message left requesting return call to pre surgical testing department.

## 2024-02-23 ENCOUNTER — Other Ambulatory Visit: Payer: Self-pay

## 2024-02-23 ENCOUNTER — Encounter (HOSPITAL_COMMUNITY)
Admission: RE | Disposition: A | Discharge status: Home / Self Care | Payer: Self-pay | Source: Home / Self Care | Attending: Internal Medicine

## 2024-02-23 ENCOUNTER — Encounter (HOSPITAL_COMMUNITY): Payer: Self-pay | Admitting: Internal Medicine

## 2024-02-23 ENCOUNTER — Ambulatory Visit (HOSPITAL_COMMUNITY): Payer: Self-pay | Admitting: Anesthesiology

## 2024-02-23 ENCOUNTER — Ambulatory Visit (HOSPITAL_BASED_OUTPATIENT_CLINIC_OR_DEPARTMENT_OTHER): Payer: Self-pay | Admitting: Anesthesiology

## 2024-02-23 ENCOUNTER — Ambulatory Visit (HOSPITAL_COMMUNITY)
Admission: RE | Admit: 2024-02-23 | Discharge: 2024-02-23 | Disposition: A | Discharge status: Home / Self Care | Payer: Self-pay | Attending: Internal Medicine | Admitting: Internal Medicine

## 2024-02-23 DIAGNOSIS — K921 Melena: Secondary | ICD-10-CM | POA: Insufficient documentation

## 2024-02-23 DIAGNOSIS — K640 First degree hemorrhoids: Secondary | ICD-10-CM | POA: Insufficient documentation

## 2024-02-23 DIAGNOSIS — K635 Polyp of colon: Secondary | ICD-10-CM | POA: Insufficient documentation

## 2024-02-23 DIAGNOSIS — K5730 Diverticulosis of large intestine without perforation or abscess without bleeding: Secondary | ICD-10-CM

## 2024-02-23 DIAGNOSIS — B9681 Helicobacter pylori [H. pylori] as the cause of diseases classified elsewhere: Secondary | ICD-10-CM | POA: Insufficient documentation

## 2024-02-23 DIAGNOSIS — K2970 Gastritis, unspecified, without bleeding: Secondary | ICD-10-CM

## 2024-02-23 DIAGNOSIS — I10 Essential (primary) hypertension: Secondary | ICD-10-CM

## 2024-02-23 DIAGNOSIS — F17200 Nicotine dependence, unspecified, uncomplicated: Secondary | ICD-10-CM | POA: Insufficient documentation

## 2024-02-23 DIAGNOSIS — K920 Hematemesis: Secondary | ICD-10-CM

## 2024-02-23 DIAGNOSIS — K2950 Unspecified chronic gastritis without bleeding: Secondary | ICD-10-CM | POA: Insufficient documentation

## 2024-02-23 DIAGNOSIS — F17210 Nicotine dependence, cigarettes, uncomplicated: Secondary | ICD-10-CM

## 2024-02-23 HISTORY — PX: COLONOSCOPY: SHX5424

## 2024-02-23 HISTORY — PX: ESOPHAGOGASTRODUODENOSCOPY: SHX5428

## 2024-02-23 SURGERY — COLONOSCOPY
Anesthesia: Monitor Anesthesia Care

## 2024-02-23 MED ORDER — PROPOFOL 1000 MG/100ML IV EMUL
INTRAVENOUS | Status: AC
Start: 2024-02-23 — End: 2024-02-23
  Filled 2024-02-23: qty 100

## 2024-02-23 MED ORDER — SODIUM CHLORIDE 0.9 % IV SOLN: INTRAVENOUS | Status: DC

## 2024-02-23 MED ORDER — PROPOFOL 500 MG/50ML IV EMUL
INTRAVENOUS | Status: DC | PRN
  Administered 2024-02-23: 175 ug/kg/min via INTRAVENOUS

## 2024-02-23 MED ORDER — SODIUM CHLORIDE 0.9 % IV SOLN
INTRAVENOUS | Status: AC | PRN
  Administered 2024-02-23: 500 mL via INTRAMUSCULAR

## 2024-02-23 MED ORDER — GLYCOPYRROLATE PF 0.2 MG/ML IJ SOSY
PREFILLED_SYRINGE | INTRAMUSCULAR | Status: DC | PRN
  Administered 2024-02-23: .2 mg via INTRAVENOUS

## 2024-02-23 MED ORDER — PROPOFOL 10 MG/ML IV BOLUS
INTRAVENOUS | Status: DC | PRN
  Administered 2024-02-23 (×2): 20 mg via INTRAVENOUS
  Administered 2024-02-23: 50 mg via INTRAVENOUS

## 2024-02-23 MED ORDER — LIDOCAINE 2% (20 MG/ML) 5 ML SYRINGE
INTRAMUSCULAR | Status: DC | PRN
  Administered 2024-02-23: 100 mg via INTRAVENOUS

## 2024-02-23 NOTE — Anesthesia Preprocedure Evaluation (Addendum)
 Anesthesia Evaluation  Patient identified by MRN, date of birth, ID band Patient awake    Reviewed: Allergy & Precautions, NPO status , Patient's Chart, lab work & pertinent test results  History of Anesthesia Complications Negative for: history of anesthetic complications  Airway Mallampati: II  TM Distance: >3 FB Neck ROM: Full    Dental no notable dental hx. (+) Teeth Intact   Pulmonary neg sleep apnea, neg COPD, Current SmokerPatient did not abstain from smoking.   Pulmonary exam normal breath sounds clear to auscultation       Cardiovascular Exercise Tolerance: Good METShypertension, Pt. on medications (-) CAD and (-) Past MI (-) dysrhythmias  Rhythm:Regular Rate:Normal - Systolic murmurs    Neuro/Psych negative neurological ROS  negative psych ROS   GI/Hepatic ,neg GERD  ,,(+)     (-) substance abuse    Endo/Other  neg diabetes    Renal/GU negative Renal ROS     Musculoskeletal   Abdominal   Peds  Hematology   Anesthesia Other Findings Past Medical History: No date: Hypertension  Reproductive/Obstetrics                              Anesthesia Physical Anesthesia Plan  ASA: 2  Anesthesia Plan: MAC   Post-op Pain Management: Minimal or no pain anticipated   Induction: Intravenous  PONV Risk Score and Plan: 0 and Propofol infusion, TIVA and Ondansetron   Airway Management Planned: Nasal Cannula  Additional Equipment: None  Intra-op Plan:   Post-operative Plan:   Informed Consent: I have reviewed the patients History and Physical, chart, labs and discussed the procedure including the risks, benefits and alternatives for the proposed anesthesia with the patient or authorized representative who has indicated his/her understanding and acceptance.     Dental advisory given  Plan Discussed with: CRNA and Surgeon  Anesthesia Plan Comments: (Discussed risks of  anesthesia with patient, including possibility of difficulty with spontaneous ventilation under anesthesia necessitating airway intervention, PONV, and rare risks such as cardiac or respiratory or neurological events, and allergic reactions. Discussed the role of CRNA in patient's perioperative care. Patient understands. Patient counseled on benefits of smoking cessation, and increased perioperative risks associated with continued smoking.  Patients BP in preop in the 180s/100s. Patient takes 2 antihypertensives normally, not taken any today. Denies any headaches, blurry vision, chest pain, or SOB.)         Anesthesia Quick Evaluation

## 2024-02-23 NOTE — Op Note (Signed)
 [facility] Patient Name: Jacob Soto Procedure Date: 02/23/2024 MRN: [id number] Attending MD: Estefana Keas DO, DO, [id number] Date of Birth: 1965/07/20 CSN: [id number] Age: 58 Admit Type: Outpatient Procedure:                Upper GI endoscopy Indications:              Upper abdominal pain Providers:                Estefana Keas DO, DO, Almarie Masters, RN,                            Haskel Chris, Technician Referring MD:              Medicines:                See the Anesthesia note for documentation of the                            administered medications Complications:            No immediate complications. Estimated Blood Loss:     Estimated blood loss was minimal. Procedure:                Pre-Anesthesia Assessment:                           - ASA Grade Assessment: II - A patient with mild                            systemic disease.                           - The risks and benefits of the procedure and the                            sedation options and risks were discussed with the                            patient. All questions were answered and informed                            consent was obtained.                           After obtaining informed consent, the endoscope was                            passed under direct vision. Throughout the                            procedure, the patient's blood pressure, pulse, and                            oxygen saturations were monitored continuously. The                            [id number]-[id number] ([id number]) Olympus endoscope was introduced  through the mouth, and advanced to the second part                            of duodenum. The upper GI endoscopy was                            accomplished without difficulty. The patient                            tolerated the procedure well. Scope In: Scope Out: Findings:      The Z-line was regular and was found 43 cm from the  incisors.      Scattered mild inflammation characterized by erythema was found in the       entire examined stomach. Biopsies were taken with a cold forceps for       Helicobacter pylori testing.      The examined duodenum was normal. Biopsies for histology were taken with       a cold forceps for evaluation of celiac disease. Impression:               - Z-line regular, 43 cm from the incisors.                           - Gastritis. Biopsied.                           - Normal examined duodenum. Biopsied. Moderate Sedation:      Monitored anesthesia care provided by anesthesia department. Recommendation:           - Await pathology results.                           - No ibuprofen , naproxen , or other non-steroidal                            anti-inflammatory drugs.                           - Perform a colonoscopy today. Procedure Code(s):        --- Professional ---                           [phone], Esophagogastroduodenoscopy, flexible,                            transoral; with biopsy, single or multiple Diagnosis Code(s):        --- Professional ---                           K29.70, Gastritis, unspecified, without bleeding                           R10.10, Upper abdominal pain, unspecified CPT copyright 2022 American Medical Association. All rights reserved. The codes documented in this report are preliminary and upon coder review may  be revised to meet current compliance requirements. Dr Estefana Keas, DO Estefana Keas DO, DO 02/23/2024 9:08:37 AM Number of Addenda: 0

## 2024-02-23 NOTE — H&P (Signed)
 [facility] H&P  Subjective: Jacob Soto is a 58 y.o. male who presents for EGD and colonoscopy. Upper abdominal discomfort, long-standing blood per rectum. Seen in office 12/23/23 and arrangements made for EGD/COL. Intentional weight loss. Loose stools, intermittent, chronic. No further blood per rectum since office visit. No dysphagia. No chest pain or shortness of breath.   Objective: General: Awake and alert, non-toxic in appearance Cardio: Regular rate and rhythm  Pulm: Clear to auscultation, no conversational dyspnea Abdomen: Soft, non-tender to palpation, bowel sounds appreciated    Assessment:  Painless hematochezia Upper abdominal discomfort  Plan:  -Recommend EGD and colonoscopy, risks of procedures discussed with patient and his significant other at time of office visit, no questions today -Further recommendations to follow pending procedures  Estefana Keas, DO [facility]

## 2024-02-23 NOTE — Discharge Instructions (Signed)

## 2024-02-23 NOTE — Anesthesia Postprocedure Evaluation (Signed)
 Anesthesia Post Note  Patient: Jacob Soto  Procedure(s) Performed: COLONOSCOPY EGD (ESOPHAGOGASTRODUODENOSCOPY)     Patient location during evaluation: Endoscopy Anesthesia Type: MAC Level of consciousness: awake and alert Pain management: pain level controlled Vital Signs Assessment: post-procedure vital signs reviewed and stable Respiratory status: spontaneous breathing, nonlabored ventilation, respiratory function stable and patient connected to nasal cannula oxygen Cardiovascular status: stable and blood pressure returned to baseline Postop Assessment: no apparent nausea or vomiting Anesthetic complications: no   No notable events documented.  Last Vitals:  Vitals:   02/23/24 0915 02/23/24 0920  BP:  (!) 164/100  Pulse: 65 63  Resp: (!) 23 20  Temp:    SpO2: 100% 100%    Last Pain:  Vitals:   02/23/24 0920  TempSrc:   PainSc: 0-No pain                 Rome Ade

## 2024-02-23 NOTE — Transfer of Care (Signed)
 Immediate Anesthesia Transfer of Care Note  Patient: Jacob Soto  Procedure(s) Performed: COLONOSCOPY EGD (ESOPHAGOGASTRODUODENOSCOPY)  Patient Location: PACU  Anesthesia Type:MAC  Level of Consciousness: awake, alert , and oriented  Airway & Oxygen Therapy: Patient Spontanous Breathing and Patient connected to face mask oxygen  Post-op Assessment: Report given to RN and Post -op Vital signs reviewed and stable  Post vital signs: Reviewed and stable  Last Vitals:  Vitals Value Taken Time  BP 163/96 02/23/24 09:00  Temp 36.4 C 02/23/24 09:00  Pulse 68 02/23/24 09:02  Resp 23 02/23/24 09:02  SpO2 100 % 02/23/24 09:02  Vitals shown include unfiled device data.  Last Pain:  Vitals:   02/23/24 0900  TempSrc: Temporal  PainSc: 0-No pain         Complications: No notable events documented.

## 2024-02-23 NOTE — Op Note (Signed)
 [facility] Patient Name: Jacob Soto Procedure Date: 02/23/2024 MRN: [id number] Attending MD: Estefana Keas DO, DO, [id number] Date of Birth: [birth year] CSN: [id number] Age: 58 Admit Type: Outpatient Procedure:                Colonoscopy Indications:              Hematochezia Providers:                Estefana Keas DO, DO, Almarie Masters, RN,                            Haskel Chris, Technician Referring MD:              Medicines:                See the Anesthesia note for documentation of the                            administered medications Complications:            No immediate complications. Estimated Blood Loss:     Estimated blood loss was minimal. Procedure:                Pre-Anesthesia Assessment:                           - ASA Grade Assessment: II - A patient with mild                            systemic disease.                           - The risks and benefits of the procedure and the                            sedation options and risks were discussed with the                            patient. All questions were answered and informed                            consent was obtained.                           After obtaining informed consent, the colonoscope                            was passed under direct vision. Throughout the                            procedure, the patient's blood pressure, pulse, and                            oxygen saturations were monitored continuously. The                            [id number]-[id number] ([id number]) Olympus Colonoscope was  introduced through the anus and advanced to the the                            cecum, identified by appendiceal orifice and                            ileocecal valve. The colonoscopy was performed                            without difficulty. The quality of the bowel                            preparation was evaluated using the BBPS [facility]                             Bowel Preparation Scale) with scores of: Right                            Colon = 2 (minor amount of residual staining, small                            fragments of stool and/or opaque liquid, but mucosa                            seen well), Transverse Colon = 3 (entire mucosa                            seen well with no residual staining, small                            fragments of stool or opaque liquid) and Left Colon                            = 2 (minor amount of residual staining, small                            fragments of stool and/or opaque liquid, but mucosa                            seen well). The total BBPS score equals 7. The                            quality of the bowel preparation was good. Scope In: 8:29:02 AM Scope Out: 8:50:04 AM Scope Withdrawal Time: 0 hours 14 minutes 53 seconds  Total Procedure Duration: 0 hours 21 minutes 2 seconds  Findings:      The digital rectal exam findings include internal hemorrhoids (Grade I).      A 3 mm polyp was found in the sigmoid colon. The polyp was sessile. The       polyp was removed with a cold snare. Resection and retrieval were       complete.      Multiple small-mouthed diverticula were found in the sigmoid colon.  Normal mucosa was found in the entire colon. Biopsies for histology were       taken with a cold forceps from the right colon and left colon for       evaluation of microscopic colitis. Impression:               - Internal hemorrhoids (Grade I) found on digital                            rectal exam.                           - One 3 mm polyp in the sigmoid colon, removed with                            a cold snare. Resected and retrieved.                           - Diverticulosis in the sigmoid colon.                           - Normal mucosa in the entire examined colon.                            Biopsied. Moderate Sedation:      Monitored anesthesia care provided by anesthesia  department. Recommendation:           - Discharge patient to home.                           - High fiber diet.                           - Continue present medications.                           - Await pathology results.                           - Repeat colonoscopy date to be determined after                            pending pathology results are reviewed for                            surveillance.                           - Return to my office in 3 months.                           - Telephone GI office if symptomatic PRN.                           - Telephone GI office for pathology results in 3  weeks. Procedure Code(s):        --- Professional ---                           [phone], Colonoscopy, flexible; with removal of                            tumor(s), polyp(s), or other lesion(s) by snare                            technique                           [id number], 59, Colonoscopy, flexible; with biopsy,                            single or multiple Diagnosis Code(s):        --- Professional ---                           D12.5, Benign neoplasm of sigmoid colon                           K64.0, First degree hemorrhoids                           K92.1, Melena (includes Hematochezia)                           K57.30, Diverticulosis of large intestine without                            perforation or abscess without bleeding CPT copyright 2022 American Medical Association. All rights reserved. The codes documented in this report are preliminary and upon coder review may  be revised to meet current compliance requirements. Dr Estefana Keas, DO Estefana Keas DO, DO 02/23/2024 9:16:10 AM Number of Addenda: 0

## 2024-02-24 ENCOUNTER — Telehealth (HOSPITAL_COMMUNITY): Payer: Self-pay

## 2024-02-24 ENCOUNTER — Encounter (HOSPITAL_COMMUNITY): Payer: Self-pay | Admitting: Internal Medicine

## 2024-02-24 NOTE — Progress Notes (Signed)
 During post call pt stated he had vomited 2 times and has some new wheezing.  Also stated he had a sore throat but this has been improving.  Pt denies fever.

## 2024-02-24 NOTE — Progress Notes (Signed)
 Routed this information to Dr Kriss

## 2024-03-01 ENCOUNTER — Telehealth: Payer: Self-pay | Admitting: Nurse Practitioner

## 2024-03-01 DIAGNOSIS — J40 Bronchitis, not specified as acute or chronic: Secondary | ICD-10-CM

## 2024-03-01 MED ORDER — PREDNISONE 20 MG PO TABS: 20.0000 mg | ORAL_TABLET | Freq: Two times a day (BID) | ORAL | 0 refills | Status: AC

## 2024-03-01 MED ORDER — DOXYCYCLINE HYCLATE 100 MG PO TABS: 100.0000 mg | ORAL_TABLET | Freq: Two times a day (BID) | ORAL | 0 refills | Status: AC

## 2024-03-01 NOTE — Progress Notes (Unsigned)
 Acute Video Visit    Virtual Visit Consent:   Jacob Soto, you are scheduled for a virtual visit with a [facility] provider today.     Just as with appointments in the office, your consent must be obtained to participate.  Your consent will be active for this visit and any virtual visit you may have with one of our providers in the next 365 days.     If you have a MyChart account, a copy of this consent can be sent to you electronically.  All virtual visits are billed to your insurance company just like a traditional visit in the office.    If the connection with a video visit is poor, the visit may have to be switched to a telephone visit.  With either a video or telephone visit, we are not always able to ensure that we have a secure connection.     I need to obtain your verbal consent now.   Are you willing to proceed with your visit today?    Jacob Soto has provided verbal consent on 03/01/2024 for a virtual visit (video or telephone).   Lauraine Kitty, FNP  Date: 03/01/2024 2:20 PM  Subjective:     Patient ID: Jacob Soto, male    DOB: 06/05/1965, 58 y.o.   MRN: [id number]  [name], connected with  Jacob Soto  ([id number], 16-Oct-1965) on 03/01/24 at  2:40 PM EDT by a video-enabled telemedicine application and verified that I am speaking with the correct person using two identifiers.   Location: Patient: [facility]  Provider: Virtual Visit Location Provider: Home Office   I discussed the limitations of evaluation and management by telemedicine and the availability of in person appointments. The patient expressed understanding and agreed to proceed.     HPI  Jacob Soto is a 58 y.o. who identifies as a male who was assigned male at birth, and is being seen today for one week of a productive cough. He is noting the sputum to be darker in color in the past few days with some wheezing and mild SOB present.   The cough is associated with wheezing, he has a history of  smoking Denies a history of asthma.   He is a patient at the [facility] currently his PCP was previously [name], he is now waiting for transition to [facility]. He is seeking a video visit today due to acute illness   He receives care and SDOH needs at the [facility]       Objective:    Physical Exam Constitutional:      General: He is not in acute distress.    Appearance: Normal appearance.  HENT:     Nose: Congestion present.     Mouth/Throat:     Mouth: Mucous membranes are moist.  Pulmonary:     Effort: Pulmonary effort is normal.  Musculoskeletal:     Cervical back: Neck supple.  Neurological:     Mental Status: He is alert and oriented to person, place, and time.  Psychiatric:        Mood and Affect: Mood normal.           Assessment & Plan:   1. Bronchitis (Primary)  - doxycycline (VIBRA-TABS) 100 MG tablet; Take 1 tablet (100 mg total) by mouth 2 (two) times daily for 7 days.  Dispense: 14 tablet; Refill: 0 - predniSONE (DELTASONE) 20 MG tablet; Take 1 tablet (20 mg total) by mouth 2 (two) times  daily with a meal for 5 days.  Dispense: 10 tablet; Refill: 0   Follow Up Instructions: I discussed the assessment and treatment plan with the patient. The patient was provided an opportunity to ask questions and all were answered. The patient agreed with the plan and demonstrated an understanding of the instructions.  A copy of instructions were sent to the patient via MyChart unless otherwise noted below.    The patient was advised to call back or seek an in-person evaluation if the symptoms worsen or if the condition fails to improve as anticipated.    Lauraine Kitty, FNP  **Disclaimer: This note may have been dictated with voice recognition software. Similar sounding words can inadvertently be transcribed and this note may contain transcription errors which may not have been corrected upon publication of note.**

## 2024-03-07 ENCOUNTER — Telehealth: Payer: Self-pay | Admitting: Nurse Practitioner

## 2024-03-07 DIAGNOSIS — J3089 Other allergic rhinitis: Secondary | ICD-10-CM

## 2024-03-07 DIAGNOSIS — I10 Essential (primary) hypertension: Secondary | ICD-10-CM

## 2024-03-07 DIAGNOSIS — N401 Enlarged prostate with lower urinary tract symptoms: Secondary | ICD-10-CM

## 2024-03-07 DIAGNOSIS — R350 Frequency of micturition: Secondary | ICD-10-CM

## 2024-03-07 MED ORDER — LISINOPRIL-HYDROCHLOROTHIAZIDE 20-25 MG PO TABS: 1.0000 | ORAL_TABLET | Freq: Every day | ORAL | 0 refills | Status: DC

## 2024-03-07 MED ORDER — LORATADINE 10 MG PO TABS: 10.0000 mg | ORAL_TABLET | Freq: Every day | ORAL | 0 refills | Status: DC

## 2024-03-07 MED ORDER — TAMSULOSIN HCL 0.4 MG PO CAPS: 0.4000 mg | ORAL_CAPSULE | Freq: Every day | ORAL | 0 refills | Status: DC

## 2024-03-07 NOTE — Progress Notes (Signed)
 Acute Video Visit    Virtual Visit Consent:   [name], you are scheduled for a virtual visit with a [facility] provider today.     Just as with appointments in the office, your consent must be obtained to participate.  Your consent will be active for this visit and any virtual visit you may have with one of our providers in the next 365 days.     If you have a MyChart account, a copy of this consent can be sent to you electronically.  All virtual visits are billed to your insurance company just like a traditional visit in the office.    If the connection with a video visit is poor, the visit may have to be switched to a telephone visit.  With either a video or telephone visit, we are not always able to ensure that we have a secure connection.     I need to obtain your verbal consent now.   Are you willing to proceed with your visit today?    Jacob Soto has provided verbal consent on 03/07/2024 for a virtual visit (video or telephone).   Lauraine Kitty, FNP  Date: 03/07/2024 2:36 PM  Subjective:     Patient ID: Jacob Soto, male    DOB: 09-May-1966, 58 y.o.   MRN: [id number]  [name], connected with  Axil Copeman  ([id number], 17-Oct-1965) on 03/07/24 at  2:40 PM EDT by a video-enabled telemedicine application and verified that I am speaking with the correct person using two identifiers.   Location:  Patient: [facility]  Provider: Virtual Visit Location Provider: Home Office   I discussed the limitations of evaluation and management by telemedicine and the availability of in person appointments. The patient expressed understanding and agreed to proceed.     HPI  Jacob Soto is a 58 y.o. who identifies as a male who was assigned male at birth, and is being seen today for assistance with medication refills  The patient previously was seeing Ronal Jenkins Houseman at the [facility] for medical needs. He is awaiting a new patient appointment next month and is in need of 30 day refill on  medications.   He receives his medical care and SDOH needs at the [facility]   He was seen last week for an acute visit for cough and symptoms since that time have been improving. Denies fever and has improved productivity in cough.   His medical history is significant for HTN, chronic pain, right shoulder injury earlier this month.        Objective:      Physical Exam Constitutional:      General: He is not in acute distress.    Appearance: Normal appearance. He is not ill-appearing.  HENT:     Nose: Nose normal.     Mouth/Throat:     Mouth: Mucous membranes are moist.  Pulmonary:     Effort: Pulmonary effort is normal.  Neurological:     Mental Status: He is alert and oriented to person, place, and time.  Psychiatric:        Mood and Affect: Mood normal.          Assessment & Plan:    Encouraged follow up with [facility] while awaiting appointment with new PCP for any acute needs   1. Allergic rhinitis due to other allergic trigger, unspecified seasonality (Primary)  - loratadine (CLARITIN) 10 MG tablet; Take 1 tablet (10 mg total) by mouth daily.  Dispense: 30 tablet; Refill:  0  2. Essential hypertension  - lisinopril-hydrochlorothiazide (ZESTORETIC) 20-25 MG tablet; Take 1 tablet by mouth daily.  Dispense: 30 tablet; Refill: 0  3. Benign prostatic hyperplasia with urinary frequency  - tamsulosin (FLOMAX) 0.4 MG CAPS capsule; Take 1 capsule (0.4 mg total) by mouth daily.  Dispense: 30 capsule; Refill: 0   Follow Up Instructions: I discussed the assessment and treatment plan with the patient. The patient was provided an opportunity to ask questions and all were answered. The patient agreed with the plan and demonstrated an understanding of the instructions.  A copy of instructions were sent to the patient via MyChart unless otherwise noted below.    The patient was advised to call back or seek an in-person evaluation if the symptoms worsen or if the condition fails to  improve as anticipated.    Lauraine Kitty, FNP  **Disclaimer: This note may have been dictated with voice recognition software. Similar sounding words can inadvertently be transcribed and this note may contain transcription errors which may not have been corrected upon publication of note.**

## 2024-03-11 LAB — SURGICAL PATHOLOGY

## 2024-04-06 ENCOUNTER — Other Ambulatory Visit: Payer: Self-pay | Admitting: Nurse Practitioner

## 2024-04-06 DIAGNOSIS — N401 Enlarged prostate with lower urinary tract symptoms: Secondary | ICD-10-CM

## 2024-04-06 DIAGNOSIS — I10 Essential (primary) hypertension: Secondary | ICD-10-CM

## 2024-04-06 MED ORDER — LISINOPRIL-HYDROCHLOROTHIAZIDE 20-25 MG PO TABS: 1.0000 | ORAL_TABLET | Freq: Every day | ORAL | 0 refills | Status: DC

## 2024-04-06 MED ORDER — TAMSULOSIN HCL 0.4 MG PO CAPS: 0.4000 mg | ORAL_CAPSULE | Freq: Every day | ORAL | 0 refills | Status: DC

## 2024-04-06 NOTE — Progress Notes (Signed)
 Patient reports leaving medications recerntly refilled in [place] and is requesting a one week refill, coming in for follow up at [facility] 04/11/24  1. Benign prostatic hyperplasia with urinary frequency  - tamsulosin  (FLOMAX ) 0.4 MG CAPS capsule; Take 1 capsule (0.4 mg total) by mouth daily for 7 days. Lost medication one week refill  Dispense: 7 capsule; Refill: 0  2. Essential hypertension  - lisinopril -hydrochlorothiazide  (ZESTORETIC ) 20-25 MG tablet; Take 1 tablet by mouth daily for 7 days. Lost medication one week refill  Dispense: 7 tablet; Refill: 0

## 2024-04-12 ENCOUNTER — Telehealth: Payer: Self-pay | Admitting: Nurse Practitioner

## 2024-04-12 DIAGNOSIS — N401 Enlarged prostate with lower urinary tract symptoms: Secondary | ICD-10-CM | POA: Insufficient documentation

## 2024-04-12 DIAGNOSIS — R3915 Urgency of urination: Secondary | ICD-10-CM

## 2024-04-12 DIAGNOSIS — A048 Other specified bacterial intestinal infections: Secondary | ICD-10-CM | POA: Insufficient documentation

## 2024-04-12 DIAGNOSIS — I10 Essential (primary) hypertension: Secondary | ICD-10-CM | POA: Insufficient documentation

## 2024-04-12 DIAGNOSIS — G8929 Other chronic pain: Secondary | ICD-10-CM | POA: Insufficient documentation

## 2024-04-12 DIAGNOSIS — M545 Low back pain, unspecified: Secondary | ICD-10-CM

## 2024-04-12 DIAGNOSIS — R350 Frequency of micturition: Secondary | ICD-10-CM

## 2024-04-12 DIAGNOSIS — Z9109 Other allergy status, other than to drugs and biological substances: Secondary | ICD-10-CM | POA: Insufficient documentation

## 2024-04-12 MED ORDER — TAMSULOSIN HCL 0.4 MG PO CAPS: 0.4000 mg | ORAL_CAPSULE | Freq: Every day | ORAL | 0 refills | Status: AC

## 2024-04-12 MED ORDER — OMEPRAZOLE 40 MG PO CPDR: 40.0000 mg | DELAYED_RELEASE_CAPSULE | Freq: Every day | ORAL | 3 refills | Status: DC

## 2024-04-12 MED ORDER — CLARITHROMYCIN 500 MG PO TABS: 500.0000 mg | ORAL_TABLET | Freq: Two times a day (BID) | ORAL | 0 refills | Status: DC

## 2024-04-12 MED ORDER — BISMUTH SUBSALICYLATE 262 MG PO CHEW: 524.0000 mg | CHEWABLE_TABLET | ORAL | 0 refills | Status: DC | PRN

## 2024-04-12 MED ORDER — AMLODIPINE BESYLATE 5 MG PO TABS: 5.0000 mg | ORAL_TABLET | Freq: Every day | ORAL | 0 refills | Status: DC

## 2024-04-12 MED ORDER — LISINOPRIL-HYDROCHLOROTHIAZIDE 20-25 MG PO TABS: 1.0000 | ORAL_TABLET | Freq: Every day | ORAL | 0 refills | Status: DC

## 2024-04-12 NOTE — Progress Notes (Addendum)
 Acute Video Visit    Virtual Visit Consent:   Jacob Soto, you are scheduled for a virtual visit with a [facility] provider today.     Just as with appointments in the office, your consent must be obtained to participate.  Your consent will be active for this visit and any virtual visit you may have with one of our providers in the next 365 days.     If you have a MyChart account, a copy of this consent can be sent to you electronically.  All virtual visits are billed to your insurance company just like a traditional visit in the office.    If the connection with a video visit is poor, the visit may have to be switched to a telephone visit.  With either a video or telephone visit, we are not always able to ensure that we have a secure connection.     I need to obtain your verbal consent now.   Are you willing to proceed with your visit today?    Jacob Soto has provided verbal consent on 04/26/2024 for a virtual visit (video or telephone).   Jacob Kitty, FNP  Date: 04/26/2024 12:45 PM  Subjective:     Patient ID: Jacob Soto, male    DOB: May 08, 1966, 58 y.o.   MRN: [id number]  LILLETTE Jacob Soto, connected with  Jacob Soto  ([id number], 07/23/1965) on 04/26/24 at 12:30 PM EST by a video-enabled telemedicine application and verified that I am speaking with the correct person using two identifiers.   Location: Patient: [facility]  Provider: Virtual Visit Location Provider: Home Office   I discussed the limitations of evaluation and management by telemedicine and the availability of in person appointments. The patient expressed understanding and agreed to proceed.      HPI  Jacob Soto is a 58 y.o. who identifies as a male who was assigned male at birth, and is being seen today for assistance with medication refills, and for explanation of recent EGD results.  He would like to keep his primary care location at the [facility]   Recent EGD in October pathology showed H. Pylori, he has not had follow  up with GI since that time.   His medical history is significant for HTN, BPH, chronic lower back pain      Objective:      Physical Exam Constitutional:      Appearance: Normal appearance.  HENT:     Nose: Nose normal.     Mouth/Throat:     Mouth: Mucous membranes are moist.  Pulmonary:     Effort: Pulmonary effort is normal.  Neurological:     Mental Status: He is alert and oriented to person, place, and time.  Psychiatric:        Mood and Affect: Mood normal.     Pathology report from EGD performed 02/23/2024 FINAL MICROSCOPIC DIAGNOSIS:   A. DUODENAL, BIOPSY:  -  Duodenal mucosa with no significant pathology   B. 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  -  Helicobacter pylori organisms are identified on the HE-stained  slide.   C. COLON, RIGHT, BIOPSY:  -  Colonic mucosa with no significant pathology.   D. COLON, LEFT, BIOPSY:  -  Colonic mucosa with no significant pathology   E. COLON, SIGMOID, POLYPECTOMY:  -  Hyperplastic polyp.       Assessment & Plan:    Follow up with PCP  at [facility] in 2 weeks, encouraged follow up with GI may be restricted by transportation limitations at this time and financial strain    Problem List Items Addressed This Visit       Cardiovascular and Mediastinum   Hypertension   Relevant Medications   amLODipine  (NORVASC ) 5 MG tablet   lisinopril -hydrochlorothiazide  (ZESTORETIC ) 20-25 MG tablet     Other   H. pylori infection - Primary   Relevant Medications   pantoprazole  (PROTONIX ) 40 MG tablet   Bismuth  Subsalicylate (PEPTO BISMOL) 262 MG CAPS   Tetracycline  HCl 500 MG TABS   metroNIDAZOLE  (FLAGYL ) 250 MG tablet   Chronic lower back pain   Environmental allergies   Benign prostatic hyperplasia (BPH) with urinary urgency   Other Visit Diagnoses       Essential hypertension       Relevant Medications   amLODipine  (NORVASC ) 5 MG  tablet   lisinopril -hydrochlorothiazide  (ZESTORETIC ) 20-25 MG tablet     Benign prostatic hyperplasia with urinary frequency       Relevant Medications   tamsulosin  (FLOMAX ) 0.4 MG CAPS capsule      Medication order to reflect recommendations from GI for H. Pylori treatment.  [facility] were not available- will be transferred to [facility]   Meds ordered this encounter  Medications   DISCONTD: clarithromycin  (BIAXIN ) 500 MG tablet    Sig: Take 1 tablet (500 mg total) by mouth 2 (two) times daily for 14 days.    Dispense:  28 tablet    Refill:  0   DISCONTD: omeprazole  (PRILOSEC) 40 MG capsule    Sig: Take 1 capsule (40 mg total) by mouth daily.    Dispense:  30 capsule    Refill:  3   DISCONTD: bismuth  subsalicylate (PEPTO-BISMOL) 262 MG chewable tablet    Sig: Chew 2 tablets (524 mg total) by mouth as needed.    Dispense:  30 tablet    Refill:  0   amLODipine  (NORVASC ) 5 MG tablet    Sig: Take 1 tablet (5 mg total) by mouth daily.    Dispense:  30 tablet    Refill:  0   tamsulosin  (FLOMAX ) 0.4 MG CAPS capsule    Sig: Take 1 capsule (0.4 mg total) by mouth daily. Lost medication one week refill    Dispense:  30 capsule    Refill:  0   lisinopril -hydrochlorothiazide  (ZESTORETIC ) 20-25 MG tablet    Sig: Take 1 tablet by mouth daily. Lost medication one week refill    Dispense:  30 tablet    Refill:  0   DISCONTD: clarithromycin  (BIAXIN ) 500 MG tablet    Sig: Take 1 tablet (500 mg total) by mouth 2 (two) times daily for 14 days.    Dispense:  28 tablet    Refill:  0   pantoprazole  (PROTONIX ) 40 MG tablet    Sig: Take 1 tablet (40 mg total) by mouth 2 (two) times daily for 14 days.    Dispense:  28 tablet    Refill:  0   Bismuth  Subsalicylate (PEPTO BISMOL) 262 MG CAPS    Sig: Take 1 capsule by mouth in the morning, at noon, in the evening, and at bedtime for 14 days.    Dispense:  56 capsule    Refill:  0   Tetracycline  HCl 500  MG TABS    Sig: Take 1 tablet by mouth 4 (four) times daily for 14 days.    Dispense:  56 each    Refill:  0   metroNIDAZOLE  (FLAGYL ) 250 MG tablet    Sig: Take 1 tablet (250 mg total) by mouth 4 (four) times daily for 14 days.    Dispense:  56 tablet    Refill:  0      Follow Up Instructions: I discussed the assessment and treatment plan with the patient. The patient was provided an opportunity to ask questions and all were answered. The patient agreed with the plan and demonstrated an understanding of the instructions.  A copy of instructions were sent to the patient via MyChart unless otherwise noted below.    The patient was advised to call back or seek an in-person evaluation if the symptoms worsen or if the condition fails to improve as anticipated.    Jacob Kitty, FNP  **Disclaimer: This note may have been dictated with voice recognition software. Similar sounding words can inadvertently be transcribed and this note may contain transcription errors which may not have been corrected upon publication of note.**

## 2024-04-12 NOTE — Addendum Note (Signed)
 Addended by: KENNYTH DOMINO E on: 04/12/2024 05:02 PM   Modules accepted: Orders

## 2024-04-26 ENCOUNTER — Other Ambulatory Visit: Payer: Self-pay

## 2024-04-26 ENCOUNTER — Telehealth: Payer: Self-pay | Admitting: Nurse Practitioner

## 2024-04-26 DIAGNOSIS — A048 Other specified bacterial intestinal infections: Secondary | ICD-10-CM

## 2024-04-26 MED ORDER — PANTOPRAZOLE SODIUM 40 MG PO TBEC
40.0000 mg | DELAYED_RELEASE_TABLET | Freq: Two times a day (BID) | ORAL | 0 refills | Status: AC
  Filled 2024-04-26: qty 28, 14d supply, fill #0

## 2024-04-26 MED ORDER — DOXYCYCLINE HYCLATE 100 MG PO TABS
100.0000 mg | ORAL_TABLET | Freq: Two times a day (BID) | ORAL | 0 refills | Status: AC
  Filled 2024-04-26: qty 20, 10d supply, fill #0

## 2024-04-26 MED ORDER — TETRACYCLINE HCL 500 MG PO CAPS
500.0000 mg | ORAL_CAPSULE | Freq: Four times a day (QID) | ORAL | 0 refills | Status: DC
  Filled 2024-04-26: qty 56, 14d supply, fill #0

## 2024-04-26 MED ORDER — FT STOMACH RELIEF 262 MG PO TABS
ORAL_TABLET | Freq: Four times a day (QID) | ORAL | 0 refills | Status: AC
  Filled 2024-04-26 (×2): qty 56, 14d supply, fill #0

## 2024-04-26 MED ORDER — AMOXICILLIN 500 MG PO CAPS
1000.0000 mg | ORAL_CAPSULE | Freq: Two times a day (BID) | ORAL | 0 refills | Status: AC
  Filled 2024-04-26: qty 40, 10d supply, fill #0

## 2024-04-26 MED ORDER — METRONIDAZOLE 250 MG PO TABS
250.0000 mg | ORAL_TABLET | Freq: Four times a day (QID) | ORAL | 0 refills | Status: DC
  Filled 2024-04-26: qty 56, 14d supply, fill #0

## 2024-04-26 NOTE — Telephone Encounter (Signed)
 Patient is uninsured and trying to complete treatment for H. Pylori. The cost of tetracycline  is very high, pharmacy recommends switching to doxycyline. Literature supports treatment of H. Pylori with combo Amoxicillin  and Doxycyline. Will DC tetracycline  and Flagyl  and update medications

## 2024-04-26 NOTE — Addendum Note (Signed)
 Addended by: KENNYTH DOMINO E on: 04/26/2024 12:46 PM   Modules accepted: Orders

## 2024-04-27 ENCOUNTER — Other Ambulatory Visit: Payer: Self-pay

## 2024-05-24 NOTE — Progress Notes (Addendum)
 Pt attended 04/12/2024 [facility] Appointment.   Per initial f/u pt was reached out via phone and was left vm.   During appointment provider noted, He would like to keep his primary care location at the [facility]. Pt was mailed letter with housing resources, [facility] flyer, [facility] flyer, & [place] 211 card  Per chart review pt does have a PCP Al[facility]; [facility] - no [facility]-visible visits), insurance, and is a smoker. Pt does indicate housing SDOH needs at this time.  Additional pt f/u to be scheduled per health equity protocol.

## 2024-06-08 ENCOUNTER — Other Ambulatory Visit: Payer: Self-pay | Admitting: *Deleted

## 2024-06-08 ENCOUNTER — Other Ambulatory Visit: Payer: Self-pay

## 2024-06-08 ENCOUNTER — Other Ambulatory Visit (HOSPITAL_COMMUNITY): Payer: Self-pay

## 2024-06-08 DIAGNOSIS — J3089 Other allergic rhinitis: Secondary | ICD-10-CM

## 2024-06-08 DIAGNOSIS — I10 Essential (primary) hypertension: Secondary | ICD-10-CM

## 2024-06-08 MED ORDER — AMLODIPINE BESYLATE 5 MG PO TABS
5.0000 mg | ORAL_TABLET | Freq: Every day | ORAL | 0 refills | Status: AC
  Filled 2024-06-08: qty 30, 30d supply, fill #0

## 2024-06-08 MED ORDER — LISINOPRIL-HYDROCHLOROTHIAZIDE 20-25 MG PO TABS
1.0000 | ORAL_TABLET | Freq: Every day | ORAL | 0 refills | Status: AC
  Filled 2024-06-08: qty 30, 30d supply, fill #0

## 2024-06-08 MED ORDER — LORATADINE 10 MG PO TABS
10.0000 mg | ORAL_TABLET | Freq: Every day | ORAL | 0 refills | Status: AC
  Filled 2024-06-08: qty 30, 30d supply, fill #0

## 2024-06-08 NOTE — Progress Notes (Unsigned)
 Called me as he had run out of meds a few days ago. Has not checked is BP. Also requests RF of antihistamine of choice Refils sent to [facility]. He will come to the [facility] next week for a CV
